# Patient Record
Sex: Female | Born: 1940 | Race: White | Hispanic: No | Smoking: Current every day smoker
Health system: Southern US, Community
[De-identification: ages and names within clinical notes are randomized; demographics above are authoritative.]

## PROBLEM LIST (undated history)

## (undated) DIAGNOSIS — T7840XA Allergy, unspecified, initial encounter: Secondary | ICD-10-CM

## (undated) DIAGNOSIS — M1990 Unspecified osteoarthritis, unspecified site: Secondary | ICD-10-CM

## (undated) DIAGNOSIS — K458 Other specified abdominal hernia without obstruction or gangrene: Secondary | ICD-10-CM

## (undated) DIAGNOSIS — I10 Essential (primary) hypertension: Secondary | ICD-10-CM

## (undated) DIAGNOSIS — J449 Chronic obstructive pulmonary disease, unspecified: Secondary | ICD-10-CM

## (undated) DIAGNOSIS — M810 Age-related osteoporosis without current pathological fracture: Secondary | ICD-10-CM

## (undated) HISTORY — PX: DILATION AND CURETTAGE OF UTERUS: SHX78

## (undated) HISTORY — DX: Allergy, unspecified, initial encounter: T78.40XA

## (undated) HISTORY — DX: Other specified abdominal hernia without obstruction or gangrene: K45.8

## (undated) HISTORY — DX: Essential (primary) hypertension: I10

---

## 1979-02-27 HISTORY — PX: OTHER SURGICAL HISTORY: SHX169

## 1979-02-27 HISTORY — PX: COLONOSCOPY: SHX174

## 2000-01-18 ENCOUNTER — Encounter: Admission: RE | Admit: 2000-01-18 | Discharge: 2000-01-18 | Payer: Self-pay | Admitting: *Deleted

## 2000-01-18 ENCOUNTER — Encounter: Payer: Self-pay | Admitting: *Deleted

## 2001-02-13 ENCOUNTER — Encounter: Admission: RE | Admit: 2001-02-13 | Discharge: 2001-02-13 | Payer: Self-pay | Admitting: *Deleted

## 2001-02-13 ENCOUNTER — Other Ambulatory Visit: Admission: RE | Admit: 2001-02-13 | Discharge: 2001-02-13 | Payer: Self-pay | Admitting: *Deleted

## 2001-02-13 ENCOUNTER — Encounter: Payer: Self-pay | Admitting: *Deleted

## 2002-02-12 ENCOUNTER — Encounter: Admission: RE | Admit: 2002-02-12 | Discharge: 2002-02-12 | Payer: Self-pay | Admitting: *Deleted

## 2002-02-12 ENCOUNTER — Encounter: Payer: Self-pay | Admitting: *Deleted

## 2002-11-05 ENCOUNTER — Other Ambulatory Visit: Admission: RE | Admit: 2002-11-05 | Discharge: 2002-11-05 | Payer: Self-pay | Admitting: Dermatology

## 2003-02-04 ENCOUNTER — Encounter: Payer: Self-pay | Admitting: *Deleted

## 2003-02-04 ENCOUNTER — Encounter: Admission: RE | Admit: 2003-02-04 | Discharge: 2003-02-04 | Payer: Self-pay | Admitting: *Deleted

## 2004-02-10 ENCOUNTER — Encounter: Admission: RE | Admit: 2004-02-10 | Discharge: 2004-02-10 | Payer: Self-pay | Admitting: *Deleted

## 2004-03-03 ENCOUNTER — Encounter: Admission: RE | Admit: 2004-03-03 | Discharge: 2004-03-03 | Payer: Self-pay | Admitting: *Deleted

## 2005-02-25 ENCOUNTER — Encounter: Admission: RE | Admit: 2005-02-25 | Discharge: 2005-02-25 | Payer: Self-pay | Admitting: *Deleted

## 2007-11-14 ENCOUNTER — Ambulatory Visit (HOSPITAL_COMMUNITY): Admission: RE | Admit: 2007-11-14 | Discharge: 2007-11-14 | Payer: Self-pay | Admitting: Family Medicine

## 2007-11-16 ENCOUNTER — Ambulatory Visit (HOSPITAL_COMMUNITY): Admission: RE | Admit: 2007-11-16 | Discharge: 2007-11-16 | Payer: Self-pay | Admitting: Family Medicine

## 2007-12-14 ENCOUNTER — Ambulatory Visit (HOSPITAL_COMMUNITY): Admission: RE | Admit: 2007-12-14 | Discharge: 2007-12-14 | Payer: Self-pay | Admitting: Ophthalmology

## 2008-06-17 ENCOUNTER — Other Ambulatory Visit: Admission: RE | Admit: 2008-06-17 | Discharge: 2008-06-17 | Payer: Self-pay | Admitting: Obstetrics & Gynecology

## 2010-05-27 ENCOUNTER — Ambulatory Visit (HOSPITAL_COMMUNITY): Admission: RE | Admit: 2010-05-27 | Discharge: 2010-05-27 | Payer: Self-pay | Admitting: Family Medicine

## 2010-07-19 ENCOUNTER — Encounter: Payer: Self-pay | Admitting: *Deleted

## 2011-01-13 ENCOUNTER — Other Ambulatory Visit: Payer: Self-pay | Admitting: Obstetrics & Gynecology

## 2011-01-13 DIAGNOSIS — Z139 Encounter for screening, unspecified: Secondary | ICD-10-CM

## 2011-01-21 ENCOUNTER — Ambulatory Visit (HOSPITAL_COMMUNITY)
Admission: RE | Admit: 2011-01-21 | Discharge: 2011-01-21 | Disposition: A | Discharge status: Home / Self Care | Payer: Medicare Other | Source: Ambulatory Visit | Attending: Obstetrics & Gynecology | Admitting: Obstetrics & Gynecology

## 2011-01-21 DIAGNOSIS — Z139 Encounter for screening, unspecified: Secondary | ICD-10-CM

## 2011-01-21 DIAGNOSIS — Z1231 Encounter for screening mammogram for malignant neoplasm of breast: Secondary | ICD-10-CM | POA: Insufficient documentation

## 2011-02-23 ENCOUNTER — Other Ambulatory Visit (HOSPITAL_COMMUNITY)
Admission: RE | Admit: 2011-02-23 | Discharge: 2011-02-23 | Disposition: A | Discharge status: Home / Self Care | Payer: Medicare Other | Source: Ambulatory Visit | Attending: Obstetrics & Gynecology | Admitting: Obstetrics & Gynecology

## 2011-02-23 DIAGNOSIS — Z124 Encounter for screening for malignant neoplasm of cervix: Secondary | ICD-10-CM | POA: Insufficient documentation

## 2011-03-25 LAB — BASIC METABOLIC PANEL
BUN: 20
Chloride: 104
GFR calc Af Amer: >60

## 2011-03-25 LAB — CBC
RDW: 14.1
WBC: 6.6

## 2011-06-11 ENCOUNTER — Other Ambulatory Visit (HOSPITAL_COMMUNITY): Payer: Self-pay | Admitting: Family Medicine

## 2011-06-11 DIAGNOSIS — Z01419 Encounter for gynecological examination (general) (routine) without abnormal findings: Secondary | ICD-10-CM

## 2011-06-11 DIAGNOSIS — Z139 Encounter for screening, unspecified: Secondary | ICD-10-CM

## 2011-06-16 ENCOUNTER — Ambulatory Visit (HOSPITAL_COMMUNITY)
Admission: RE | Admit: 2011-06-16 | Discharge: 2011-06-16 | Disposition: A | Discharge status: Home / Self Care | Payer: Medicare Other | Source: Ambulatory Visit | Attending: Family Medicine | Admitting: Family Medicine

## 2011-06-16 DIAGNOSIS — Z139 Encounter for screening, unspecified: Secondary | ICD-10-CM

## 2011-06-16 DIAGNOSIS — Z01419 Encounter for gynecological examination (general) (routine) without abnormal findings: Secondary | ICD-10-CM

## 2011-06-16 DIAGNOSIS — M899 Disorder of bone, unspecified: Secondary | ICD-10-CM | POA: Insufficient documentation

## 2011-06-30 ENCOUNTER — Telehealth: Payer: Self-pay

## 2011-06-30 NOTE — Telephone Encounter (Signed)
Called pt and she is getting ready to go out of town to [place] for [employer]. Not sure how long she will be gone. She will call me when she is ready to schedule.

## 2013-01-02 ENCOUNTER — Other Ambulatory Visit: Payer: Self-pay | Admitting: Obstetrics & Gynecology

## 2013-01-02 DIAGNOSIS — Z139 Encounter for screening, unspecified: Secondary | ICD-10-CM

## 2013-01-05 ENCOUNTER — Ambulatory Visit (HOSPITAL_COMMUNITY)
Admission: RE | Admit: 2013-01-05 | Discharge: 2013-01-05 | Disposition: A | Discharge status: Home / Self Care | Payer: Medicare Other | Source: Ambulatory Visit | Attending: Obstetrics & Gynecology | Admitting: Obstetrics & Gynecology

## 2013-01-05 DIAGNOSIS — Z139 Encounter for screening, unspecified: Secondary | ICD-10-CM

## 2013-01-05 DIAGNOSIS — Z1231 Encounter for screening mammogram for malignant neoplasm of breast: Secondary | ICD-10-CM | POA: Insufficient documentation

## 2013-02-06 ENCOUNTER — Ambulatory Visit (INDEPENDENT_AMBULATORY_CARE_PROVIDER_SITE_OTHER): Payer: Medicare Other | Admitting: Obstetrics & Gynecology

## 2013-02-06 ENCOUNTER — Encounter: Payer: Self-pay | Admitting: Obstetrics & Gynecology

## 2013-02-06 ENCOUNTER — Other Ambulatory Visit (HOSPITAL_COMMUNITY)
Admission: RE | Admit: 2013-02-06 | Discharge: 2013-02-06 | Disposition: A | Discharge status: Home / Self Care | Payer: Medicare Other | Source: Ambulatory Visit | Attending: Obstetrics & Gynecology | Admitting: Obstetrics & Gynecology

## 2013-02-06 VITALS — BP 160/90 | Ht 60.0 in | Wt 102.0 lb

## 2013-02-06 DIAGNOSIS — Z1151 Encounter for screening for human papillomavirus (HPV): Secondary | ICD-10-CM | POA: Insufficient documentation

## 2013-02-06 DIAGNOSIS — Z1212 Encounter for screening for malignant neoplasm of rectum: Secondary | ICD-10-CM

## 2013-02-06 DIAGNOSIS — M1990 Unspecified osteoarthritis, unspecified site: Secondary | ICD-10-CM

## 2013-02-06 DIAGNOSIS — I10 Essential (primary) hypertension: Secondary | ICD-10-CM | POA: Insufficient documentation

## 2013-02-06 DIAGNOSIS — Z01419 Encounter for gynecological examination (general) (routine) without abnormal findings: Secondary | ICD-10-CM | POA: Insufficient documentation

## 2013-02-06 MED ORDER — RISEDRONATE SODIUM 150 MG PO TABS: 150.0000 mg | ORAL_TABLET | ORAL | Status: DC

## 2013-02-06 NOTE — Addendum Note (Signed)
Addended by: Criss Alvine on: 02/06/2013 11:30 AM   Modules accepted: Orders

## 2013-02-06 NOTE — Progress Notes (Signed)
Patient ID: Chelsea Ramos, female   DOB: 12/07/1940, 72 y.o.   MRN: [id number] Subjective:     Chelsea Ramos is a 72 y.o. female here for a routine exam.  No LMP recorded. Patient is postmenopausal. No obstetric history on file. Current complaints: none.  Personal health questionnaire reviewed: no.   Gynecologic History No LMP recorded. Patient is postmenopausal. Contraception: post menopausal status Last Pap: 2012. Results were: normal Last mammogram: 2014. Results were: normal  Obstetric History OB History   Grav Para Term Preterm Abortions TAB SAB Ect Mult Living                   The following portions of the patient's history were reviewed and updated as appropriate: allergies, current medications, past family history, past medical history, past social history, past surgical history and problem list.  Review of Systems  Review of Systems  Constitutional: Negative for fever, chills, weight loss, malaise/fatigue and diaphoresis.  HENT: Negative for hearing loss, ear pain, nosebleeds, congestion, sore throat, neck pain, tinnitus and ear discharge.   Eyes: Negative for blurred vision, double vision, photophobia, pain, discharge and redness.  Respiratory: Negative for cough, hemoptysis, sputum production, shortness of breath, wheezing and stridor.   Cardiovascular: Negative for chest pain, palpitations, orthopnea, claudication, leg swelling and PND.  Gastrointestinal: negative for abdominal pain. Negative for heartburn, nausea, vomiting, diarrhea, constipation, blood in stool and melena.  Genitourinary: Negative for dysuria, urgency, frequency, hematuria and flank pain.  Musculoskeletal: Negative for myalgias, back pain, joint pain and falls.  Skin: Negative for itching and rash.  Neurological: Negative for dizziness, tingling, tremors, sensory change, speech change, focal weakness, seizures, loss of consciousness, weakness and headaches.  Endo/Heme/Allergies: Negative for environmental  allergies and polydipsia. Does not bruise/bleed easily.  Psychiatric/Behavioral: Negative for depression, suicidal ideas, hallucinations, memory loss and substance abuse. The patient is not nervous/anxious and does not have insomnia.        Objective:    Physical Exam  Vitals reviewed. Constitutional: She is oriented to person, place, and time. She appears well-developed and well-nourished.  HENT:  Head: Normocephalic and atraumatic.        Right Ear: External ear normal.  Left Ear: External ear normal.  Nose: Nose normal.  Mouth/Throat: Oropharynx is clear and moist.  Eyes: Conjunctivae and EOM are normal. Pupils are equal, round, and reactive to light. Right eye exhibits no discharge. Left eye exhibits no discharge. No scleral icterus.  Neck: Normal range of motion. Neck supple. No tracheal deviation present. No thyromegaly present.  Cardiovascular: Normal rate, regular rhythm, normal heart sounds and intact distal pulses.  Exam reveals no gallop and no friction rub.   No murmur heard. Respiratory: Effort normal and breath sounds normal. No respiratory distress. She has no wheezes. She has no rales. She exhibits no tenderness.  Breast exam normal no masses tenderness skin or nipple changes GI: Soft. Bowel sounds are normal. She exhibits no distension and no mass. There is no tenderness. There is no rebound and no guarding.  Genitourinary:       Vulva is normal without lesions Vagina is pink moist without discharge Cervix normal in appearance and pap is done Uterus is normal size shape and contour Adnexa is negative with normal sized ovaries   Rectal hemeoccult negative, no masses normal tone Musculoskeletal: Normal range of motion. She exhibits no edema and no tenderness.  Neurological: She is alert and oriented to person, place, and time. She has normal reflexes.  She displays normal reflexes. No cranial nerve deficit. She exhibits normal muscle tone. Coordination normal.  Skin:  Skin is warm and dry. No rash noted. No erythema. No pallor.  Psychiatric: She has a normal mood and affect. Her behavior is normal. Judgment and thought content normal.       Assessment:    Healthy female exam.    Plan:    Mammogram ordered. Follow up in: 1 year.

## 2013-02-06 NOTE — Addendum Note (Signed)
Addended by: Lazaro Arms on: 02/06/2013 11:12 AM   Modules accepted: Orders

## 2013-07-25 ENCOUNTER — Other Ambulatory Visit (HOSPITAL_COMMUNITY): Payer: Self-pay | Admitting: Family Medicine

## 2013-07-25 ENCOUNTER — Ambulatory Visit (HOSPITAL_COMMUNITY)
Admission: RE | Admit: 2013-07-25 | Discharge: 2013-07-25 | Disposition: A | Discharge status: Home / Self Care | Payer: Medicare Other | Source: Ambulatory Visit | Attending: Family Medicine | Admitting: Family Medicine

## 2013-07-25 DIAGNOSIS — I10 Essential (primary) hypertension: Secondary | ICD-10-CM | POA: Insufficient documentation

## 2013-07-25 DIAGNOSIS — J984 Other disorders of lung: Secondary | ICD-10-CM | POA: Insufficient documentation

## 2013-07-25 DIAGNOSIS — Z0000 Encounter for general adult medical examination without abnormal findings: Secondary | ICD-10-CM

## 2013-07-25 DIAGNOSIS — R059 Cough, unspecified: Secondary | ICD-10-CM | POA: Insufficient documentation

## 2013-07-25 DIAGNOSIS — R05 Cough: Secondary | ICD-10-CM | POA: Insufficient documentation

## 2013-07-25 DIAGNOSIS — R0602 Shortness of breath: Secondary | ICD-10-CM | POA: Insufficient documentation

## 2013-07-25 DIAGNOSIS — J4489 Other specified chronic obstructive pulmonary disease: Secondary | ICD-10-CM | POA: Insufficient documentation

## 2013-07-25 DIAGNOSIS — Z87891 Personal history of nicotine dependence: Secondary | ICD-10-CM | POA: Insufficient documentation

## 2013-07-25 DIAGNOSIS — J449 Chronic obstructive pulmonary disease, unspecified: Secondary | ICD-10-CM | POA: Insufficient documentation

## 2013-10-02 ENCOUNTER — Telehealth: Payer: Self-pay

## 2013-10-02 NOTE — Telephone Encounter (Signed)
PT was referred by Dr. Orson Ape for screening colonoscopy. LMOM for a return call.

## 2013-11-14 NOTE — Telephone Encounter (Signed)
Letter to PCP

## 2014-03-08 ENCOUNTER — Other Ambulatory Visit: Payer: Self-pay | Admitting: Obstetrics & Gynecology

## 2014-10-29 ENCOUNTER — Other Ambulatory Visit: Payer: Self-pay | Admitting: Obstetrics & Gynecology

## 2014-10-29 DIAGNOSIS — Z1231 Encounter for screening mammogram for malignant neoplasm of breast: Secondary | ICD-10-CM

## 2014-11-12 ENCOUNTER — Ambulatory Visit (INDEPENDENT_AMBULATORY_CARE_PROVIDER_SITE_OTHER): Payer: Medicare HMO | Admitting: Obstetrics & Gynecology

## 2014-11-12 ENCOUNTER — Encounter: Payer: Self-pay | Admitting: Obstetrics & Gynecology

## 2014-11-12 VITALS — BP 140/90 | HR 100 | Ht 61.4 in | Wt 100.0 lb

## 2014-11-12 DIAGNOSIS — Z01419 Encounter for gynecological examination (general) (routine) without abnormal findings: Secondary | ICD-10-CM

## 2014-11-12 MED ORDER — RISEDRONATE SODIUM 150 MG PO TABS: ORAL_TABLET | ORAL | Status: DC

## 2014-11-12 NOTE — Progress Notes (Signed)
Patient ID: Chelsea Ramos, female   DOB: [birth year], 75 y.o.   MRN: [id number] Subjective:     Chelsea Ramos is a 74 y.o. female here for a routine exam.  No LMP recorded. Patient is postmenopausal. No obstetric history on file. Birth Control Method:  postmenopausal Current complaints: discharge, yellow with some blood occasionally, worse in the afternoon.   Current acute medical issues:  none   Recent Gynecologic History No LMP recorded. Patient is postmenopausal. Last Pap: 2014,  normal Last mammogram: 2015, scheduled this Friday  Past Medical History  Diagnosis Date  . Allergy   . Hypertension     History reviewed. No pertinent past surgical history.  OB History    No data available      History   Social History  . Marital Status: Widowed    Spouse Name: N/A  . Number of Children: N/A  . Years of Education: N/A   Social History Main Topics  . Smoking status: Former Research scientist (life sciences)  . Smokeless tobacco: Not on file     Comment: cigarettes only.  . Alcohol Use: Not on file  . Drug Use: Not on file  . Sexual Activity: Not on file   Other Topics Concern  . None   Social History Narrative    Family History  Problem Relation Age of Onset  . Prostate cancer Father   . Diabetes Neg Hx   . Heart disease Mother      Current outpatient prescriptions:  .  aspirin 81 MG tablet, Take 81 mg by mouth daily., Disp: , Rfl:  .  lisinopril (PRINIVIL,ZESTRIL) 5 MG tablet, Take 5 mg by mouth daily., Disp: , Rfl:  .  Multiple Vitamin (MULTIVITAMIN) capsule, Take 1 capsule by mouth daily., Disp: , Rfl:  .  naproxen (NAPROSYN) 500 MG tablet, Take 500 mg by mouth 2 (two) times daily with a meal., Disp: , Rfl:  .  risedronate (ACTONEL) 150 MG tablet, TAKE ONE TABLET BY MOUTH EVERY 30 DAYS WITH WATER ON EMPTY STOMACH, NOTHING BY MOUTH OR LIE DOWN FOR NEXT 30 MINUTES, Disp: 1 tablet, Rfl: 11 .  Multiple Vitamins-Calcium (DAILY COMBO MULTIVITS/CALCIUM PO), Take by mouth., Disp: , Rfl:   Review  of Systems  Review of Systems  Constitutional: Negative for fever, chills, weight loss, malaise/fatigue and diaphoresis.  HENT: Negative for hearing loss, ear pain, nosebleeds, congestion, sore throat, neck pain, tinnitus and ear discharge.   Eyes: Negative for blurred vision, double vision, photophobia, pain, discharge and redness.  Respiratory: Negative for cough, hemoptysis, sputum production, shortness of breath, wheezing and stridor.   Cardiovascular: Negative for chest pain, palpitations, orthopnea, claudication, leg swelling and PND.  Gastrointestinal: negative for abdominal pain. Negative for heartburn, nausea, vomiting, diarrhea, constipation, blood in stool and melena.  Genitourinary: Negative for dysuria, urgency, frequency, hematuria and flank pain.  Musculoskeletal: Negative for myalgias, back pain, joint pain and falls.  Skin: Negative for itching and rash.  Neurological: Negative for dizziness, tingling, tremors, sensory change, speech change, focal weakness, seizures, loss of consciousness, weakness and headaches.  Endo/Heme/Allergies: Negative for environmental allergies and polydipsia. Does not bruise/bleed easily.  Psychiatric/Behavioral: Negative for depression, suicidal ideas, hallucinations, memory loss and substance abuse. The patient is not nervous/anxious and does not have insomnia.        Objective:  Blood pressure 140/90, pulse 100, height 5' 1.4" (1.56 m), weight 100 lb (45.36 kg).   Physical Exam  Vitals reviewed. Constitutional: She is oriented to person, place, and  time. She appears well-developed and well-nourished.  HENT:  Head: Normocephalic and atraumatic.        Right Ear: External ear normal.  Left Ear: External ear normal.  Nose: Nose normal.  Mouth/Throat: Oropharynx is clear and moist.  Eyes: Conjunctivae and EOM are normal. Pupils are equal, round, and reactive to light. Right eye exhibits no discharge. Left eye exhibits no discharge. No scleral  icterus.  Neck: Normal range of motion. Neck supple. No tracheal deviation present. No thyromegaly present.  Cardiovascular: Normal rate, regular rhythm, normal heart sounds and intact distal pulses.  Exam reveals no gallop and no friction rub.   No murmur heard. Respiratory: Effort normal and breath sounds normal. No respiratory distress. She has no wheezes. She has no rales. She exhibits no tenderness.  GI: Soft. Bowel sounds are normal. She exhibits no distension and no mass. There is no tenderness. There is no rebound and no guarding.  Genitourinary:  Breasts no masses skin changes or nipple changes bilaterally      Vulva is normal without lesions Vagina is pink moist without discharge Cervix normal in appearance and pap is not done Uterus is normal size shape and contour Adnexa is negative with normal sized ovaries  Musculoskeletal: Normal range of motion. She exhibits no edema and no tenderness.  Neurological: She is alert and oriented to person, place, and time. She has normal reflexes. She displays normal reflexes. No cranial nerve deficit. She exhibits normal muscle tone. Coordination normal.  Skin: Skin is warm and dry. No rash noted. No erythema. No pallor.  Psychiatric: She has a normal mood and affect. Her behavior is normal. Judgment and thought content normal.       Assessment:    Healthy female exam.  Atrophic vaginal changes with associated discharge   Plan:    Mammogram ordered. Follow up in: 2-3 years.

## 2014-11-15 ENCOUNTER — Ambulatory Visit (HOSPITAL_COMMUNITY)
Admission: RE | Admit: 2014-11-15 | Discharge: 2014-11-15 | Disposition: A | Discharge status: Home / Self Care | Payer: Medicare HMO | Source: Ambulatory Visit | Attending: Obstetrics & Gynecology | Admitting: Obstetrics & Gynecology

## 2014-11-15 DIAGNOSIS — Z1231 Encounter for screening mammogram for malignant neoplasm of breast: Secondary | ICD-10-CM | POA: Insufficient documentation

## 2014-12-11 ENCOUNTER — Other Ambulatory Visit (HOSPITAL_COMMUNITY): Payer: Self-pay | Admitting: Internal Medicine

## 2014-12-11 ENCOUNTER — Ambulatory Visit (HOSPITAL_COMMUNITY)
Admission: RE | Admit: 2014-12-11 | Discharge: 2014-12-11 | Disposition: A | Discharge status: Home / Self Care | Payer: Medicare HMO | Source: Ambulatory Visit | Attending: Internal Medicine | Admitting: Internal Medicine

## 2014-12-11 DIAGNOSIS — R059 Cough, unspecified: Secondary | ICD-10-CM

## 2014-12-11 DIAGNOSIS — R05 Cough: Secondary | ICD-10-CM

## 2015-01-20 ENCOUNTER — Telehealth: Payer: Self-pay

## 2015-01-20 NOTE — Telephone Encounter (Signed)
Had received a VM from [facility] that pt wants to be scheduled between 01/27/2015 and 02/14/2015 for screening colonoscopy.

## 2015-01-20 NOTE — Telephone Encounter (Signed)
I called pt and she said she will not be able to do the colonoscopy in August this year.  She will call sometime in August and get scheduled for September.  I am sending Larene Pickett a letter.

## 2015-11-06 ENCOUNTER — Telehealth: Payer: Self-pay

## 2015-11-06 NOTE — Telephone Encounter (Signed)
Triaged pt today.

## 2015-11-10 NOTE — Telephone Encounter (Signed)
Ok to schedule.

## 2015-11-10 NOTE — Telephone Encounter (Addendum)
Gastroenterology Pre-Procedure Review  Request Date: 11/06/2015 Requesting Physician:  Dr. Gerarda Fraction  PATIENT REVIEW QUESTIONS: The patient responded to the following health history questions as indicated:    1. Diabetes Melitis: no 2. Joint replacements in the past 12 months: no 3. Major health problems in the past 3 months: no 4. Has an artificial valve or MVP: no 5. Has a defibrillator: no 6. Has been advised in past to take antibiotics in advance of a procedure like teeth cleaning: no 7. Family history of colon cancer: no  8. Alcohol Use: no 9. History of sleep apnea: no     MEDICATIONS & ALLERGIES:    Patient reports the following regarding taking any blood thinners:   Plavix? NO Aspirin? YES Coumadin? no  Patient confirms/reports the following medications:  Current Outpatient Prescriptions  Medication Sig Dispense Refill  . aspirin 81 MG tablet Take 81 mg by mouth daily.    Marland Kitchen losartan (COZAAR) 25 MG tablet Take 25 mg by mouth daily.    . Multiple Vitamin (MULTIVITAMIN) capsule Take 1 capsule by mouth daily.    . Multiple Vitamins-Calcium (DAILY COMBO MULTIVITS/CALCIUM PO) Take by mouth.    . naproxen (NAPROSYN) 500 MG tablet Take 500 mg by mouth 2 (two) times daily with a meal.    . risedronate (ACTONEL) 150 MG tablet TAKE ONE TABLET BY MOUTH EVERY 30 DAYS WITH WATER ON EMPTY STOMACH, NOTHING BY MOUTH OR LIE DOWN FOR NEXT 30 MINUTES 1 tablet 11   No current facility-administered medications for this visit.    Patient confirms/reports the following allergies:  Allergies  Allergen Reactions  . Prednisone Itching    No orders of the defined types were placed in this encounter.    AUTHORIZATION INFORMATION Primary Insurance:   ID #:  Group #:  Pre-Cert / Auth required:  Pre-Cert / Auth #:   Secondary Insurance:   ID #:  Group #:  Pre-Cert / Auth required: Pre-Cert / Auth #:   SCHEDULE INFORMATION: Procedure has been scheduled as follows:  Date:  11/25/2015                Time: 8:30 Am  Location: [facility] ([place])  This Gastroenterology Pre-Precedure Review Form is being routed to the following provider(s): R. Garfield Cornea, MD

## 2015-11-11 ENCOUNTER — Other Ambulatory Visit: Payer: Self-pay

## 2015-11-11 DIAGNOSIS — Z1211 Encounter for screening for malignant neoplasm of colon: Secondary | ICD-10-CM

## 2015-11-11 MED ORDER — NA SULFATE-K SULFATE-MG SULF 17.5-3.13-1.6 GM/177ML PO SOLN: 1.0000 | ORAL | Status: DC

## 2015-11-11 NOTE — Telephone Encounter (Signed)
Rx sent to the pharmacy and instructions mailed to pt.  

## 2015-11-14 NOTE — Telephone Encounter (Signed)
Pt called and said she forgot to tell me about her hernia, low on her left side. Said she is not sure that is what it is, it has been there several months and it appeared after she had done some lifting and pushing a mower. Said she is very independent and hasn't told anyone and she wanted to know if it would interfere with her colonoscopy. She said she does not have any pain from it. I told her to see her PCP next week, Dr. Gerarda Fraction and then let us know. She said she would.

## 2015-11-14 NOTE — Telephone Encounter (Signed)
Agree with having evaluation by PCP. OK to proceed with colonoscopy.

## 2015-11-18 ENCOUNTER — Telehealth: Payer: Self-pay

## 2015-11-18 NOTE — Telephone Encounter (Signed)
I called [employer] at [phone] and got automation. Pa not required for screening colonoscopy as out patient.

## 2015-11-18 NOTE — Telephone Encounter (Signed)
Pt is aware. She has appt with PCP today at 3:00 pm.

## 2015-11-19 NOTE — Telephone Encounter (Signed)
PT called and said she saw dr. Gerarda Fraction yesterday. He said she did have a small hernia, but he agrees, no problem with having a colonoscopy at this time.

## 2015-11-19 NOTE — Telephone Encounter (Signed)
Noted  

## 2015-11-25 ENCOUNTER — Ambulatory Visit (HOSPITAL_COMMUNITY)
Admission: RE | Admit: 2015-11-25 | Discharge: 2015-11-25 | Disposition: A | Discharge status: Home / Self Care | Payer: Medicare HMO | Source: Ambulatory Visit | Attending: Internal Medicine | Admitting: Internal Medicine

## 2015-11-25 ENCOUNTER — Encounter (HOSPITAL_COMMUNITY)
Admission: RE | Disposition: A | Discharge status: Home / Self Care | Payer: Self-pay | Source: Ambulatory Visit | Attending: Internal Medicine

## 2015-11-25 ENCOUNTER — Encounter (HOSPITAL_COMMUNITY): Payer: Self-pay | Admitting: *Deleted

## 2015-11-25 DIAGNOSIS — M810 Age-related osteoporosis without current pathological fracture: Secondary | ICD-10-CM | POA: Diagnosis not present

## 2015-11-25 DIAGNOSIS — Z8042 Family history of malignant neoplasm of prostate: Secondary | ICD-10-CM | POA: Insufficient documentation

## 2015-11-25 DIAGNOSIS — I10 Essential (primary) hypertension: Secondary | ICD-10-CM | POA: Diagnosis not present

## 2015-11-25 DIAGNOSIS — J449 Chronic obstructive pulmonary disease, unspecified: Secondary | ICD-10-CM | POA: Insufficient documentation

## 2015-11-25 DIAGNOSIS — Z87891 Personal history of nicotine dependence: Secondary | ICD-10-CM | POA: Insufficient documentation

## 2015-11-25 DIAGNOSIS — Z79899 Other long term (current) drug therapy: Secondary | ICD-10-CM | POA: Insufficient documentation

## 2015-11-25 DIAGNOSIS — Z791 Long term (current) use of non-steroidal anti-inflammatories (NSAID): Secondary | ICD-10-CM | POA: Diagnosis not present

## 2015-11-25 DIAGNOSIS — Z1211 Encounter for screening for malignant neoplasm of colon: Secondary | ICD-10-CM

## 2015-11-25 DIAGNOSIS — Z7982 Long term (current) use of aspirin: Secondary | ICD-10-CM | POA: Insufficient documentation

## 2015-11-25 DIAGNOSIS — K641 Second degree hemorrhoids: Secondary | ICD-10-CM | POA: Diagnosis not present

## 2015-11-25 DIAGNOSIS — M1990 Unspecified osteoarthritis, unspecified site: Secondary | ICD-10-CM | POA: Diagnosis not present

## 2015-11-25 DIAGNOSIS — K5730 Diverticulosis of large intestine without perforation or abscess without bleeding: Secondary | ICD-10-CM | POA: Diagnosis not present

## 2015-11-25 HISTORY — DX: Chronic obstructive pulmonary disease, unspecified: J44.9

## 2015-11-25 HISTORY — PX: COLONOSCOPY: SHX5424

## 2015-11-25 HISTORY — DX: Unspecified osteoarthritis, unspecified site: M19.90

## 2015-11-25 HISTORY — DX: Age-related osteoporosis without current pathological fracture: M81.0

## 2015-11-25 SURGERY — COLONOSCOPY
Anesthesia: Moderate Sedation

## 2015-11-25 MED ORDER — MEPERIDINE HCL 100 MG/ML IJ SOLN
INTRAMUSCULAR | Status: AC
  Filled 2015-11-25: qty 2

## 2015-11-25 MED ORDER — MIDAZOLAM HCL 5 MG/5ML IJ SOLN
INTRAMUSCULAR | Status: AC
  Filled 2015-11-25: qty 10

## 2015-11-25 MED ORDER — SODIUM CHLORIDE 0.9 % IV SOLN
INTRAVENOUS | Status: DC
Start: 2015-11-25 — End: 2015-11-25
  Administered 2015-11-25: 08:00:00 via INTRAVENOUS

## 2015-11-25 MED ORDER — MEPERIDINE HCL 100 MG/ML IJ SOLN
INTRAMUSCULAR | Status: DC | PRN
  Administered 2015-11-25 (×2): 25 mg via INTRAVENOUS

## 2015-11-25 MED ORDER — STERILE WATER FOR IRRIGATION IR SOLN
Status: DC | PRN
  Administered 2015-11-25: 08:00:00

## 2015-11-25 MED ORDER — ONDANSETRON HCL 4 MG/2ML IJ SOLN
INTRAMUSCULAR | Status: AC
  Filled 2015-11-25: qty 2

## 2015-11-25 MED ORDER — MIDAZOLAM HCL 5 MG/5ML IJ SOLN
INTRAMUSCULAR | Status: DC | PRN
  Administered 2015-11-25 (×2): 1 mg via INTRAVENOUS
  Administered 2015-11-25: 2 mg via INTRAVENOUS

## 2015-11-25 MED ORDER — ONDANSETRON HCL 4 MG/2ML IJ SOLN
INTRAMUSCULAR | Status: DC | PRN
Start: 2015-11-25 — End: 2015-11-25
  Administered 2015-11-25: 4 mg via INTRAVENOUS

## 2015-11-25 NOTE — Discharge Instructions (Signed)
Colonoscopy Discharge Instructions  Read the instructions outlined below and refer to this sheet in the next few weeks. These discharge instructions provide you with general information on caring for yourself after you leave the hospital. Your doctor may also give you specific instructions. While your treatment has been planned according to the most current medical practices available, unavoidable complications occasionally occur. If you have any problems or questions after discharge, call Dr. Gala Romney at [phone]. ACTIVITY  You may resume your regular activity, but move at a slower pace for the next 24 hours.   Take frequent rest periods for the next 24 hours.   Walking will help get rid of the air and reduce the bloated feeling in your belly (abdomen).   No driving for 24 hours (because of the medicine (anesthesia) used during the test).    Do not sign any important legal documents or operate any machinery for 24 hours (because of the anesthesia used during the test).  NUTRITION  Drink plenty of fluids.   You may resume your normal diet as instructed by your doctor.   Begin with a light meal and progress to your normal diet. Heavy or fried foods are harder to digest and may make you feel sick to your stomach (nauseated).   Avoid alcoholic beverages for 24 hours or as instructed.  MEDICATIONS  You may resume your normal medications unless your doctor tells you otherwise.  WHAT YOU CAN EXPECT TODAY  Some feelings of bloating in the abdomen.   Passage of more gas than usual.   Spotting of blood in your stool or on the toilet paper.  IF YOU HAD POLYPS REMOVED DURING THE COLONOSCOPY:  No aspirin products for 7 days or as instructed.   No alcohol for 7 days or as instructed.   Eat a soft diet for the next 24 hours.  FINDING OUT THE RESULTS OF YOUR TEST Not all test results are available during your visit. If your test results are not back during the visit, make an appointment  with your caregiver to find out the results. Do not assume everything is normal if you have not heard from your caregiver or the medical facility. It is important for you to follow up on all of your test results.  SEEK IMMEDIATE MEDICAL ATTENTION IF:  You have more than a spotting of blood in your stool.   Your belly is swollen (abdominal distention).   You are nauseated or vomiting.   You have a temperature over 101.   You have abdominal pain or discomfort that is severe or gets worse throughout the day.     Diverticulosis information provided  I do not recommend a future colonoscopy unless new symptoms develop  Diverticulosis Diverticulosis is the condition that develops when small pouches (diverticula) form in the wall of your colon. Your colon, or large intestine, is where water is absorbed and stool is formed. The pouches form when the inside layer of your colon pushes through weak spots in the outer layers of your colon. CAUSES  No one knows exactly what causes diverticulosis. RISK FACTORS  Being older than 73. Your risk for this condition increases with age. Diverticulosis is rare in people younger than 40 years. By age 46, almost everyone has it.  Eating a low-fiber diet.  Being frequently constipated.  Being overweight.  Not getting enough exercise.  Smoking.  Taking over-the-counter pain medicines, like aspirin and ibuprofen. SYMPTOMS  Most people with diverticulosis do not have symptoms. DIAGNOSIS  Because diverticulosis often has no symptoms, health care providers often discover the condition during an exam for other colon problems. In many cases, a health care provider will diagnose diverticulosis while using a flexible scope to examine the colon (colonoscopy). TREATMENT  If you have never developed an infection related to diverticulosis, you may not need treatment. If you have had an infection before, treatment may include:  Eating more fruits, vegetables,  and grains.  Taking a fiber supplement.  Taking a live bacteria supplement (probiotic).  Taking medicine to relax your colon. HOME CARE INSTRUCTIONS   Drink at least 6-8 glasses of water each day to prevent constipation.  Try not to strain when you have a bowel movement.  Keep all follow-up appointments. If you have had an infection before:  Increase the fiber in your diet as directed by your health care provider or dietitian.  Take a dietary fiber supplement if your health care provider approves.  Only take medicines as directed by your health care provider. SEEK MEDICAL CARE IF:   You have abdominal pain.  You have bloating.  You have cramps.  You have not gone to the bathroom in 3 days. SEEK IMMEDIATE MEDICAL CARE IF:   Your pain gets worse.  Yourbloating becomes very bad.  You have a fever or chills, and your symptoms suddenly get worse.  You begin vomiting.  You have bowel movements that are bloody or black. MAKE SURE YOU:  Understand these instructions.  Will watch your condition.  Will get help right away if you are not doing well or get worse.   This information is not intended to replace advice given to you by your health care provider. Make sure you discuss any questions you have with your health care provider.   Document Released: 03/11/2004 Document Revised: 06/19/2013 Document Reviewed: 05/09/2013 Elsevier Interactive Patient Education [employer].

## 2015-11-25 NOTE — Op Note (Signed)
[facility] Patient Name: Chelsea Ramos Procedure Date: 11/25/2015 8:07 AM MRN: [id number] Date of Birth: [birth year] Attending MD: Norvel Richards , MD CSN: [id number] Age: 75 Admit Type: Outpatient Procedure:                Colonoscopy - screening Indications:              Screening for colorectal malignant neoplasm Providers:                Norvel Richards, MD, Gwenlyn Fudge, RN, Georgeann Oppenheim, Technician Referring MD:              Medicines:                Midazolam 4 mg IV, Meperidine 50 mg IV, Ondansetron                            4 mg IV Complications:            No immediate complications. Estimated Blood Loss:     Estimated blood loss: none. Procedure:                Pre-Anesthesia Assessment:                           - Prior to the procedure, a History and Physical                            was performed, and patient medications and                            allergies were reviewed. The patient's tolerance of                            previous anesthesia was also reviewed. The risks                            and benefits of the procedure and the sedation                            options and risks were discussed with the patient.                            All questions were answered, and informed consent                            was obtained. Prior Anticoagulants: The patient has                            taken no previous anticoagulant or antiplatelet                            agents. ASA Grade Assessment: II - A patient with  mild systemic disease. After reviewing the risks                            and benefits, the patient was deemed in                            satisfactory condition to undergo the procedure.                           After obtaining informed consent, the colonoscope                            was passed under direct vision. Throughout the                            procedure,  the patient's blood pressure, pulse, and                            oxygen saturations were monitored continuously. The                            [id number]) scope was introduced through                            the anus and advanced to the the cecum, identified                            by appendiceal orifice and ileocecal valve. The                            colonoscopy was performed without difficulty. The                            patient tolerated the procedure well. The entire                            colon was examined. The ileocecal valve,                            appendiceal orifice, and rectum were photographed.                            The quality of the bowel preparation was adequate. Scope In: 8:22:59 AM Scope Out: 8:35:46 AM Scope Withdrawal Time: 0 hours 6 minutes 51 seconds  Total Procedure Duration: 0 hours 12 minutes 47 seconds  Findings:      The perianal and digital rectal examinations were normal.      Hemorrhoids were found during retroflexion. The hemorrhoids were Grade       II (internal hemorrhoids that prolapse but reduce spontaneously).      Medium-mouthed diverticula were found in the sigmoid colon.      The exam was otherwise without abnormality on direct and retroflexion       views. Impression:               -  Hemorrhoids.                           - Diverticulosis in the sigmoid colon.                           - The examination was otherwise normal on direct                            and retroflexion views.                           - No specimens collected. Moderate Sedation:      Moderate (conscious) sedation was administered by the endoscopy nurse       and supervised by the endoscopist. The following parameters were       monitored: oxygen saturation, heart rate, blood pressure, respiratory       rate, EKG, adequacy of pulmonary ventilation, and response to care.       Total physician intraservice time was 20  minutes. Recommendation:           - Patient has a contact number available for                            emergencies. The signs and symptoms of potential                            delayed complications were discussed with the                            patient. Return to normal activities tomorrow.                            Written discharge instructions were provided to the                            patient.                           - Advance diet as tolerated.                           - Continue present medications.                           - No repeat colonoscopy due to age.                           - Return to GI clinic PRN. Procedure Code(s):        --- Professional ---                           [phone], Colonoscopy, flexible; diagnostic, including                            collection of specimen(s) by brushing or washing,  when performed (separate procedure)                           99152, Moderate sedation services provided by the                            same physician or other qualified health care                            professional performing the diagnostic or                            therapeutic service that the sedation supports,                            requiring the presence of an independent trained                            observer to assist in the monitoring of the                            patient's level of consciousness and physiological                            status; initial 15 minutes of intraservice time,                            patient age 70 years or older Diagnosis Code(s):        --- Professional ---                           Z12.11, Encounter for screening for malignant                            neoplasm of colon                           K64.1, Second degree hemorrhoids                           K57.30, Diverticulosis of large intestine without                            perforation or abscess without  bleeding CPT copyright 2016 American Medical Association. All rights reserved. The codes documented in this report are preliminary and upon coder review may  be revised to meet current compliance requirements. Cristopher Estimable. Manna Gose, MD Norvel Richards, MD 11/25/2015 8:49:03 AM This report has been signed electronically. Number of Addenda: 0

## 2015-11-25 NOTE — H&P (Signed)
'@LOGO' @   Primary Care Physician:  Glo Herring., MD Primary Gastroenterologist:  Dr. Gala Romney  Pre-Procedure History & Physical: HPI:  Chelsea Ramos is a 75 y.o. female is here for a screening colonoscopy. No bowel symptoms. Left femoral/inguinal hernia. Reported negative colonoscopy in 1980s. No family history of colon cancer.  Past Medical History  Diagnosis Date  . Allergy   . Hypertension   . Osteoporosis   . COPD (chronic obstructive pulmonary disease) ([place])   . Arthritis     Past Surgical History  Procedure Laterality Date  . Dilation and curettage of uterus      X 2  . Left foot surgery      1980's  . Left cataract extraction  1980's  . Right cataract extraction    . Colonoscopy  1980's    Prior to Admission medications   Medication Sig Start Date End Date Taking? Authorizing Provider  aspirin 81 MG tablet Take 81 mg by mouth daily.   Yes Historical Provider, MD  diphenhydrAMINE (EQ ALLERGY) 25 mg capsule Take 25 mg by mouth daily as needed for itching or allergies.   Yes Historical Provider, MD  losartan (COZAAR) 25 MG tablet Take 25 mg by mouth daily.   Yes Historical Provider, MD  Multiple Vitamin (MULTIVITAMIN) capsule Take 1 capsule by mouth daily.   Yes Historical Provider, MD  Na Sulfate-K Sulfate-Mg Sulf (SUPREP BOWEL PREP) SOLN Take 1 kit by mouth as directed. 11/11/15  Yes Daneil Dolin, MD  naproxen (NAPROSYN) 500 MG tablet Take 500 mg by mouth 2 (two) times daily with a meal.   Yes Historical Provider, MD  risedronate (ACTONEL) 150 MG tablet TAKE ONE TABLET BY MOUTH EVERY 30 DAYS WITH WATER ON EMPTY STOMACH, NOTHING BY MOUTH OR LIE DOWN FOR NEXT 30 MINUTES 11/12/14  Yes Florian Buff, MD    Allergies as of 11/11/2015 - Review Complete 11/11/2015  Allergen Reaction Noted  . Prednisone Itching 02/06/2013    Family History  Problem Relation Age of Onset  . Prostate cancer Father   . Diabetes Neg Hx   . Heart disease Mother     Social History    Social History  . Marital Status: Widowed    Spouse Name: N/A  . Number of Children: N/A  . Years of Education: N/A   Occupational History  . Not on file.   Social History Main Topics  . Smoking status: Former Smoker -- 0.50 packs/day for 40 years    Types: Cigarettes  . Smokeless tobacco: Not on file     Comment: cigarettes only.  . Alcohol Use: No  . Drug Use: No  . Sexual Activity: Not on file   Other Topics Concern  . Not on file   Social History Narrative    Review of Systems: See HPI, otherwise negative ROS  Physical Exam: BP 159/81 mmHg  Pulse 118  Temp(Src) 97.8 F (36.6 C) (Oral)  Resp 19  Ht '5\' 1"'  (1.549 m)  Wt 94 lb (42.638 kg)  BMI 17.77 kg/m2  SpO2 99% General:   Alert,   pleasant and cooperative in NAD Lungs:  Clear throughout to auscultation.   No wheezes, crackles, or rhonchi. No acute distress. Heart:  Regular rate and rhythm; no murmurs, clicks, rubs,  or gallops. Abdomen:  Soft, nontender and nondistended. No masses, hepatosplenomegaly or hernias noted. Normal bowel sounds, without guarding, and without rebound.   Msk:  Symmetrical without gross deformities. Normal posture. Pulses:  Normal pulses  noted. Extremities:  Without clubbing or edema.  Impression/Plan: [name] is now here to undergo a screening colonoscopy.  Average risk examination.  Risks, benefits, limitations, imponderables and alternatives regarding colonoscopy have been reviewed with the patient. Questions have been answered. All parties agreeable.     Notice:  This dictation was prepared with Dragon dictation along with smaller phrase technology. Any transcriptional errors that result from this process are unintentional and may not be corrected upon review.

## 2015-12-01 ENCOUNTER — Encounter (HOSPITAL_COMMUNITY): Payer: Self-pay | Admitting: Internal Medicine

## 2015-12-24 ENCOUNTER — Ambulatory Visit (HOSPITAL_COMMUNITY)
Admission: RE | Admit: 2015-12-24 | Discharge: 2015-12-24 | Disposition: A | Discharge status: Home / Self Care | Payer: Medicare HMO | Source: Ambulatory Visit | Attending: Internal Medicine | Admitting: Internal Medicine

## 2015-12-24 ENCOUNTER — Other Ambulatory Visit (HOSPITAL_COMMUNITY): Payer: Self-pay | Admitting: Internal Medicine

## 2015-12-24 DIAGNOSIS — I517 Cardiomegaly: Secondary | ICD-10-CM | POA: Insufficient documentation

## 2015-12-24 DIAGNOSIS — R059 Cough, unspecified: Secondary | ICD-10-CM

## 2015-12-24 DIAGNOSIS — R918 Other nonspecific abnormal finding of lung field: Secondary | ICD-10-CM | POA: Diagnosis not present

## 2015-12-24 DIAGNOSIS — R05 Cough: Secondary | ICD-10-CM | POA: Diagnosis present

## 2016-01-05 ENCOUNTER — Other Ambulatory Visit: Payer: Self-pay | Admitting: Obstetrics & Gynecology

## 2016-11-04 DIAGNOSIS — I10 Essential (primary) hypertension: Secondary | ICD-10-CM | POA: Diagnosis not present

## 2016-11-04 DIAGNOSIS — J449 Chronic obstructive pulmonary disease, unspecified: Secondary | ICD-10-CM | POA: Diagnosis not present

## 2016-11-04 DIAGNOSIS — J343 Hypertrophy of nasal turbinates: Secondary | ICD-10-CM | POA: Diagnosis not present

## 2016-11-04 DIAGNOSIS — Z1389 Encounter for screening for other disorder: Secondary | ICD-10-CM | POA: Diagnosis not present

## 2016-11-04 DIAGNOSIS — J302 Other seasonal allergic rhinitis: Secondary | ICD-10-CM | POA: Diagnosis not present

## 2017-01-11 ENCOUNTER — Other Ambulatory Visit (HOSPITAL_COMMUNITY): Payer: Self-pay | Admitting: Internal Medicine

## 2017-01-11 DIAGNOSIS — Z1231 Encounter for screening mammogram for malignant neoplasm of breast: Secondary | ICD-10-CM

## 2017-01-12 ENCOUNTER — Ambulatory Visit (HOSPITAL_COMMUNITY)
Admission: RE | Admit: 2017-01-12 | Discharge: 2017-01-12 | Disposition: A | Discharge status: Home / Self Care | Payer: PPO | Source: Ambulatory Visit | Attending: Internal Medicine | Admitting: Internal Medicine

## 2017-01-12 DIAGNOSIS — M1991 Primary osteoarthritis, unspecified site: Secondary | ICD-10-CM | POA: Diagnosis not present

## 2017-01-12 DIAGNOSIS — Z0001 Encounter for general adult medical examination with abnormal findings: Secondary | ICD-10-CM | POA: Diagnosis not present

## 2017-01-12 DIAGNOSIS — Z1389 Encounter for screening for other disorder: Secondary | ICD-10-CM | POA: Diagnosis not present

## 2017-01-12 DIAGNOSIS — I10 Essential (primary) hypertension: Secondary | ICD-10-CM | POA: Diagnosis not present

## 2017-01-12 DIAGNOSIS — Z681 Body mass index (BMI) 19 or less, adult: Secondary | ICD-10-CM | POA: Diagnosis not present

## 2017-01-12 DIAGNOSIS — Z1231 Encounter for screening mammogram for malignant neoplasm of breast: Secondary | ICD-10-CM | POA: Diagnosis not present

## 2017-01-12 DIAGNOSIS — J449 Chronic obstructive pulmonary disease, unspecified: Secondary | ICD-10-CM | POA: Diagnosis not present

## 2017-01-12 DIAGNOSIS — J309 Allergic rhinitis, unspecified: Secondary | ICD-10-CM | POA: Diagnosis not present

## 2017-03-29 ENCOUNTER — Ambulatory Visit (INDEPENDENT_AMBULATORY_CARE_PROVIDER_SITE_OTHER): Payer: PPO | Admitting: Obstetrics & Gynecology

## 2017-03-29 ENCOUNTER — Encounter: Payer: Self-pay | Admitting: Obstetrics & Gynecology

## 2017-03-29 VITALS — BP 122/70 | HR 78 | Ht 62.0 in | Wt 95.0 lb

## 2017-03-29 DIAGNOSIS — Z01419 Encounter for gynecological examination (general) (routine) without abnormal findings: Secondary | ICD-10-CM

## 2017-03-29 MED ORDER — RISEDRONATE SODIUM 150 MG PO TABS: ORAL_TABLET | ORAL | 11 refills | Status: DC

## 2017-03-29 NOTE — Progress Notes (Signed)
Subjective:     Chelsea Ramos is a 76 y.o. female here for a routine exam.  No LMP recorded. Patient is postmenopausal. No obstetric history on file. Birth Control Method:  Post menopausal Menstrual Calendar(currently): amenorrheic  Current complaints: hemorrhoid flare up.   Current acute medical issues:  Abdominal wall hernia   Recent Gynecologic History No LMP recorded. Patient is postmenopausal. Last Pap: several years ago,   Last mammogram: 12/2016,  normal  Past Medical History:  Diagnosis Date  . Allergy   . Arthritis   . COPD (chronic obstructive pulmonary disease) ([place])   . Hypertension   . Osteoporosis     Past Surgical History:  Procedure Laterality Date  . COLONOSCOPY  1980's  . COLONOSCOPY N/A 11/25/2015   Procedure: COLONOSCOPY;  Surgeon: Daneil Dolin, MD;  Location: [facility];  Service: Endoscopy;  Laterality: N/A;  8:30 Am  . DILATION AND CURETTAGE OF UTERUS     X 2  . Left cataract extraction  1980's  . Left foot surgery     1980's  . Right cataract extraction      OB History    No data available      Social History   Social History  . Marital status: Widowed    Spouse name: N/A  . Number of children: N/A  . Years of education: N/A   Social History Main Topics  . Smoking status: Former Smoker    Packs/day: 0.50    Years: 40.00    Types: Cigarettes  . Smokeless tobacco: Not on file     Comment: cigarettes only.  . Alcohol use No  . Drug use: No  . Sexual activity: Not on file   Other Topics Concern  . Not on file   Social History Narrative  . No narrative on file    Family History  Problem Relation Age of Onset  . Prostate cancer Father   . Heart disease Mother   . Diabetes Neg Hx      Current Outpatient Prescriptions:  .  amLODipine (NORVASC) 10 MG tablet, Take 10 mg by mouth daily., Disp: , Rfl:  .  aspirin 81 MG tablet, Take 81 mg by mouth daily., Disp: , Rfl:  .  Multiple Vitamin (MULTIVITAMIN) capsule, Take 1 capsule  by mouth daily., Disp: , Rfl:  .  naproxen (NAPROSYN) 500 MG tablet, Take 500 mg by mouth 2 (two) times daily with a meal., Disp: , Rfl:  .  risedronate (ACTONEL) 150 MG tablet, TAKE ONE TABLET BY MOUTH EVERY 30 DAYS WITH WATER ON EMPTY STOMACH, NOTHING BY MOUTH OR LIE DOWN FOR NEXT 30 MINUTES, Disp: 1 tablet, Rfl: 11 .  diphenhydrAMINE (EQ ALLERGY) 25 mg capsule, Take 25 mg by mouth daily as needed for itching or allergies., Disp: , Rfl:  .  losartan (COZAAR) 25 MG tablet, Take 25 mg by mouth daily., Disp: , Rfl:  .  Na Sulfate-K Sulfate-Mg Sulf (SUPREP BOWEL PREP) SOLN, Take 1 kit by mouth as directed. (Patient not taking: Reported on 03/29/2017), Disp: 1 Bottle, Rfl: 0  Review of Systems  Review of Systems  Constitutional: Negative for fever, chills, weight loss, malaise/fatigue and diaphoresis.  HENT: Negative for hearing loss, ear pain, nosebleeds, congestion, sore throat, neck pain, tinnitus and ear discharge.   Eyes: Negative for blurred vision, double vision, photophobia, pain, discharge and redness.  Respiratory: Negative for cough, hemoptysis, sputum production, shortness of breath, wheezing and stridor.   Cardiovascular: Negative for chest  pain, palpitations, orthopnea, claudication, leg swelling and PND.  Gastrointestinal: negative for abdominal pain. Negative for heartburn, nausea, vomiting, diarrhea, constipation, blood in stool and melena.  Genitourinary: Negative for dysuria, urgency, frequency, hematuria and flank pain.  Musculoskeletal: Negative for myalgias, back pain, joint pain and falls.  Skin: Negative for itching and rash.  Neurological: Negative for dizziness, tingling, tremors, sensory change, speech change, focal weakness, seizures, loss of consciousness, weakness and headaches.  Endo/Heme/Allergies: Negative for environmental allergies and polydipsia. Does not bruise/bleed easily.  Psychiatric/Behavioral: Negative for depression, suicidal ideas, hallucinations, memory  loss and substance abuse. The patient is not nervous/anxious and does not have insomnia.        Objective:  Blood pressure 122/70, pulse 78, height '5\' 2"'  (1.575 m), weight 95 lb (43.1 kg).   Physical Exam  Vitals reviewed. Constitutional: She is oriented to person, place, and time. She appears well-developed and well-nourished.  HENT:  Head: Normocephalic and atraumatic.        Right Ear: External ear normal.  Left Ear: External ear normal.  Nose: Nose normal.  Mouth/Throat: Oropharynx is clear and moist.  Eyes: Conjunctivae and EOM are normal. Pupils are equal, round, and reactive to light. Right eye exhibits no discharge. Left eye exhibits no discharge. No scleral icterus.  Neck: Normal range of motion. Neck supple. No tracheal deviation present. No thyromegaly present.  Cardiovascular: Normal rate, regular rhythm, normal heart sounds and intact distal pulses.  Exam reveals no gallop and no friction rub.   No murmur heard. Respiratory: Effort normal and breath sounds normal. No respiratory distress. She has no wheezes. She has no rales. She exhibits no tenderness.  GI: Soft. Bowel sounds are normal. She exhibits no distension and no mass. There is no tenderness. There is no rebound and no guarding.  Genitourinary:  Breasts no masses skin changes or nipple changes bilaterally      Vulva is normal without lesions Vagina is pink moist without discharge Cervix normal in appearance and pap is not done Uterus is normal size shape and contour Adnexa is negative  {Rectal   Deferred due to hemorrhoid, had colonoscopy Musculoskeletal: Normal range of motion. She exhibits no edema and no tenderness.  Neurological: She is alert and oriented to person, place, and time. She has normal reflexes. She displays normal reflexes. No cranial nerve deficit. She exhibits normal muscle tone. Coordination normal.  Skin: Skin is warm and dry. No rash noted. No erythema. No pallor.  Psychiatric: She has a  normal mood and affect. Her behavior is normal. Judgment and thought content normal.       Medications Ordered at today's visit: Meds ordered this encounter  Medications  . amLODipine (NORVASC) 10 MG tablet    Sig: Take 10 mg by mouth daily.  . risedronate (ACTONEL) 150 MG tablet    Sig: TAKE ONE TABLET BY MOUTH EVERY 30 DAYS WITH WATER ON EMPTY STOMACH, NOTHING BY MOUTH OR LIE DOWN FOR NEXT 30 MINUTES    Dispense:  1 tablet    Refill:  11    Other orders placed at today's visit: No orders of the defined types were placed in this encounter.     Assessment:    Healthy female exam.    Plan:    Mammogram ordered. Follow up in: 2 years.     Return in about 2 years (around 03/30/2019) for yearly, with Dr Elonda Husky.

## 2017-06-13 ENCOUNTER — Other Ambulatory Visit: Payer: Self-pay | Admitting: Pharmacy Technician

## 2017-06-13 ENCOUNTER — Other Ambulatory Visit: Payer: Self-pay | Admitting: Pharmacist

## 2017-06-13 NOTE — Patient Outreach (Signed)
[facility]) Care Management  06/13/2017  Chelsea Ramos May 17, 1941 [id number]  Incoming [employer] EMMI call in reference to medication adherence. HIPAA identifiers verified and verbal consent received. Patient was discontinued  on Losartan and it was replaced with Amlodipine. Patient states she takes daily as prescribed and does not have any barriers that would affect her adherence.  Doreene Burke, [place] [phone]

## 2017-06-13 NOTE — Patient Outreach (Signed)
Receive a message from [place] letting me know that Ms. Ciocca reports that her PCP discontinued her losartan and switched her to amlodipine.  Call patient's PCP, Dr. Nolon Rod, office to follow up. Speak with Letta Median, nurse in the office, who reports that per Dr. Nolon Rod note, patient was discontinued off of the losartan due to pruritis and was started on amlodipine 5 mg daily in its place. Update patient's allergy and medication records in Epic accordingly.   Harlow Asa, PharmD, [place] [facility] [phone]

## 2017-08-31 DIAGNOSIS — Z681 Body mass index (BMI) 19 or less, adult: Secondary | ICD-10-CM | POA: Diagnosis not present

## 2017-08-31 DIAGNOSIS — M810 Age-related osteoporosis without current pathological fracture: Secondary | ICD-10-CM | POA: Diagnosis not present

## 2017-08-31 DIAGNOSIS — G894 Chronic pain syndrome: Secondary | ICD-10-CM | POA: Diagnosis not present

## 2017-08-31 DIAGNOSIS — J329 Chronic sinusitis, unspecified: Secondary | ICD-10-CM | POA: Diagnosis not present

## 2017-08-31 DIAGNOSIS — I10 Essential (primary) hypertension: Secondary | ICD-10-CM | POA: Diagnosis not present

## 2017-08-31 DIAGNOSIS — M1991 Primary osteoarthritis, unspecified site: Secondary | ICD-10-CM | POA: Diagnosis not present

## 2017-08-31 DIAGNOSIS — J449 Chronic obstructive pulmonary disease, unspecified: Secondary | ICD-10-CM | POA: Diagnosis not present

## 2017-08-31 DIAGNOSIS — Z1389 Encounter for screening for other disorder: Secondary | ICD-10-CM | POA: Diagnosis not present

## 2017-08-31 DIAGNOSIS — J9801 Acute bronchospasm: Secondary | ICD-10-CM | POA: Diagnosis not present

## 2017-09-05 ENCOUNTER — Other Ambulatory Visit (HOSPITAL_COMMUNITY): Payer: Self-pay | Admitting: Internal Medicine

## 2017-09-05 DIAGNOSIS — E2839 Other primary ovarian failure: Secondary | ICD-10-CM

## 2017-09-13 DIAGNOSIS — Z1384 Encounter for screening for dental disorders: Secondary | ICD-10-CM | POA: Diagnosis not present

## 2017-09-14 ENCOUNTER — Ambulatory Visit (HOSPITAL_COMMUNITY)
Admission: RE | Admit: 2017-09-14 | Discharge: 2017-09-14 | Disposition: A | Discharge status: Home / Self Care | Payer: PPO | Source: Ambulatory Visit | Attending: Internal Medicine | Admitting: Internal Medicine

## 2017-09-14 DIAGNOSIS — Z1389 Encounter for screening for other disorder: Secondary | ICD-10-CM | POA: Diagnosis not present

## 2017-09-14 DIAGNOSIS — E2839 Other primary ovarian failure: Secondary | ICD-10-CM | POA: Diagnosis not present

## 2017-09-14 DIAGNOSIS — M810 Age-related osteoporosis without current pathological fracture: Secondary | ICD-10-CM | POA: Diagnosis not present

## 2017-09-14 DIAGNOSIS — Z780 Asymptomatic menopausal state: Secondary | ICD-10-CM | POA: Diagnosis not present

## 2017-12-15 ENCOUNTER — Other Ambulatory Visit (HOSPITAL_COMMUNITY): Payer: Self-pay | Admitting: Internal Medicine

## 2017-12-15 ENCOUNTER — Ambulatory Visit (HOSPITAL_COMMUNITY)
Admission: RE | Admit: 2017-12-15 | Discharge: 2017-12-15 | Disposition: A | Discharge status: Home / Self Care | Payer: PPO | Source: Ambulatory Visit | Attending: Internal Medicine | Admitting: Internal Medicine

## 2017-12-15 DIAGNOSIS — R0602 Shortness of breath: Secondary | ICD-10-CM | POA: Diagnosis not present

## 2017-12-15 DIAGNOSIS — L03116 Cellulitis of left lower limb: Secondary | ICD-10-CM | POA: Diagnosis not present

## 2017-12-15 DIAGNOSIS — M810 Age-related osteoporosis without current pathological fracture: Secondary | ICD-10-CM | POA: Diagnosis not present

## 2017-12-15 DIAGNOSIS — R059 Cough, unspecified: Secondary | ICD-10-CM

## 2017-12-15 DIAGNOSIS — R05 Cough: Secondary | ICD-10-CM

## 2017-12-15 DIAGNOSIS — M1991 Primary osteoarthritis, unspecified site: Secondary | ICD-10-CM | POA: Diagnosis not present

## 2017-12-15 DIAGNOSIS — Z1389 Encounter for screening for other disorder: Secondary | ICD-10-CM | POA: Diagnosis not present

## 2017-12-15 DIAGNOSIS — J449 Chronic obstructive pulmonary disease, unspecified: Secondary | ICD-10-CM | POA: Diagnosis not present

## 2017-12-15 DIAGNOSIS — Z681 Body mass index (BMI) 19 or less, adult: Secondary | ICD-10-CM | POA: Diagnosis not present

## 2018-01-17 DIAGNOSIS — T1490XA Injury, unspecified, initial encounter: Secondary | ICD-10-CM | POA: Diagnosis not present

## 2018-01-17 DIAGNOSIS — Z681 Body mass index (BMI) 19 or less, adult: Secondary | ICD-10-CM | POA: Diagnosis not present

## 2018-01-17 DIAGNOSIS — L03116 Cellulitis of left lower limb: Secondary | ICD-10-CM | POA: Diagnosis not present

## 2018-01-17 DIAGNOSIS — L97921 Non-pressure chronic ulcer of unspecified part of left lower leg limited to breakdown of skin: Secondary | ICD-10-CM | POA: Diagnosis not present

## 2018-01-17 DIAGNOSIS — Z0000 Encounter for general adult medical examination without abnormal findings: Secondary | ICD-10-CM | POA: Diagnosis not present

## 2018-01-17 DIAGNOSIS — Z1389 Encounter for screening for other disorder: Secondary | ICD-10-CM | POA: Diagnosis not present

## 2018-01-17 DIAGNOSIS — I872 Venous insufficiency (chronic) (peripheral): Secondary | ICD-10-CM | POA: Diagnosis not present

## 2018-01-17 DIAGNOSIS — L97925 Non-pressure chronic ulcer of unspecified part of left lower leg with muscle involvement without evidence of necrosis: Secondary | ICD-10-CM | POA: Diagnosis not present

## 2018-01-17 DIAGNOSIS — Z0001 Encounter for general adult medical examination with abnormal findings: Secondary | ICD-10-CM | POA: Diagnosis not present

## 2018-01-17 DIAGNOSIS — R600 Localized edema: Secondary | ICD-10-CM | POA: Diagnosis not present

## 2018-01-27 ENCOUNTER — Other Ambulatory Visit (HOSPITAL_COMMUNITY): Payer: Self-pay | Admitting: Podiatry

## 2018-01-27 DIAGNOSIS — R52 Pain, unspecified: Secondary | ICD-10-CM | POA: Diagnosis not present

## 2018-01-27 DIAGNOSIS — I83023 Varicose veins of left lower extremity with ulcer of ankle: Secondary | ICD-10-CM | POA: Diagnosis not present

## 2018-01-27 DIAGNOSIS — I739 Peripheral vascular disease, unspecified: Secondary | ICD-10-CM

## 2018-01-31 ENCOUNTER — Ambulatory Visit (HOSPITAL_COMMUNITY)
Admission: RE | Admit: 2018-01-31 | Discharge: 2018-01-31 | Disposition: A | Discharge status: Home / Self Care | Payer: PPO | Source: Ambulatory Visit | Attending: Podiatry | Admitting: Podiatry

## 2018-01-31 DIAGNOSIS — I739 Peripheral vascular disease, unspecified: Secondary | ICD-10-CM | POA: Diagnosis not present

## 2018-01-31 DIAGNOSIS — L97323 Non-pressure chronic ulcer of left ankle with necrosis of muscle: Secondary | ICD-10-CM | POA: Diagnosis not present

## 2018-02-03 DIAGNOSIS — I83023 Varicose veins of left lower extremity with ulcer of ankle: Secondary | ICD-10-CM | POA: Diagnosis not present

## 2018-02-10 DIAGNOSIS — I83023 Varicose veins of left lower extremity with ulcer of ankle: Secondary | ICD-10-CM | POA: Diagnosis not present

## 2018-03-03 DIAGNOSIS — I83023 Varicose veins of left lower extremity with ulcer of ankle: Secondary | ICD-10-CM | POA: Diagnosis not present

## 2018-03-06 DIAGNOSIS — I83023 Varicose veins of left lower extremity with ulcer of ankle: Secondary | ICD-10-CM | POA: Diagnosis not present

## 2018-03-17 DIAGNOSIS — I83023 Varicose veins of left lower extremity with ulcer of ankle: Secondary | ICD-10-CM | POA: Diagnosis not present

## 2018-03-31 DIAGNOSIS — I83023 Varicose veins of left lower extremity with ulcer of ankle: Secondary | ICD-10-CM | POA: Diagnosis not present

## 2018-04-10 DIAGNOSIS — L570 Actinic keratosis: Secondary | ICD-10-CM | POA: Diagnosis not present

## 2018-04-10 DIAGNOSIS — L820 Inflamed seborrheic keratosis: Secondary | ICD-10-CM | POA: Diagnosis not present

## 2018-04-10 DIAGNOSIS — D0439 Carcinoma in situ of skin of other parts of face: Secondary | ICD-10-CM | POA: Diagnosis not present

## 2018-04-10 DIAGNOSIS — X32XXXA Exposure to sunlight, initial encounter: Secondary | ICD-10-CM | POA: Diagnosis not present

## 2018-04-28 DIAGNOSIS — I83023 Varicose veins of left lower extremity with ulcer of ankle: Secondary | ICD-10-CM | POA: Diagnosis not present

## 2018-05-11 DIAGNOSIS — Z85828 Personal history of other malignant neoplasm of skin: Secondary | ICD-10-CM | POA: Diagnosis not present

## 2018-05-11 DIAGNOSIS — L218 Other seborrheic dermatitis: Secondary | ICD-10-CM | POA: Diagnosis not present

## 2018-05-11 DIAGNOSIS — Z08 Encounter for follow-up examination after completed treatment for malignant neoplasm: Secondary | ICD-10-CM | POA: Diagnosis not present

## 2018-06-02 DIAGNOSIS — M25572 Pain in left ankle and joints of left foot: Secondary | ICD-10-CM | POA: Diagnosis not present

## 2018-06-02 DIAGNOSIS — I83023 Varicose veins of left lower extremity with ulcer of ankle: Secondary | ICD-10-CM | POA: Diagnosis not present

## 2018-06-15 DIAGNOSIS — I872 Venous insufficiency (chronic) (peripheral): Secondary | ICD-10-CM | POA: Diagnosis not present

## 2018-06-15 DIAGNOSIS — I10 Essential (primary) hypertension: Secondary | ICD-10-CM | POA: Diagnosis not present

## 2018-06-15 DIAGNOSIS — Z7982 Long term (current) use of aspirin: Secondary | ICD-10-CM | POA: Diagnosis not present

## 2018-06-15 DIAGNOSIS — Z79899 Other long term (current) drug therapy: Secondary | ICD-10-CM | POA: Diagnosis not present

## 2018-06-29 DIAGNOSIS — I872 Venous insufficiency (chronic) (peripheral): Secondary | ICD-10-CM | POA: Diagnosis not present

## 2018-08-01 DIAGNOSIS — L97929 Non-pressure chronic ulcer of unspecified part of left lower leg with unspecified severity: Secondary | ICD-10-CM | POA: Diagnosis not present

## 2018-08-01 DIAGNOSIS — I872 Venous insufficiency (chronic) (peripheral): Secondary | ICD-10-CM | POA: Diagnosis not present

## 2018-08-29 DIAGNOSIS — L97529 Non-pressure chronic ulcer of other part of left foot with unspecified severity: Secondary | ICD-10-CM | POA: Diagnosis not present

## 2018-08-29 DIAGNOSIS — I872 Venous insufficiency (chronic) (peripheral): Secondary | ICD-10-CM | POA: Diagnosis not present

## 2018-09-26 DIAGNOSIS — L97929 Non-pressure chronic ulcer of unspecified part of left lower leg with unspecified severity: Secondary | ICD-10-CM | POA: Diagnosis not present

## 2018-09-26 DIAGNOSIS — I872 Venous insufficiency (chronic) (peripheral): Secondary | ICD-10-CM | POA: Diagnosis not present

## 2018-10-03 DIAGNOSIS — E1165 Type 2 diabetes mellitus with hyperglycemia: Secondary | ICD-10-CM | POA: Diagnosis not present

## 2018-10-03 DIAGNOSIS — K219 Gastro-esophageal reflux disease without esophagitis: Secondary | ICD-10-CM | POA: Diagnosis not present

## 2018-10-03 DIAGNOSIS — I10 Essential (primary) hypertension: Secondary | ICD-10-CM | POA: Diagnosis not present

## 2018-10-03 DIAGNOSIS — Z0000 Encounter for general adult medical examination without abnormal findings: Secondary | ICD-10-CM | POA: Diagnosis not present

## 2018-10-03 DIAGNOSIS — Z0001 Encounter for general adult medical examination with abnormal findings: Secondary | ICD-10-CM | POA: Diagnosis not present

## 2018-10-03 DIAGNOSIS — C61 Malignant neoplasm of prostate: Secondary | ICD-10-CM | POA: Diagnosis not present

## 2018-10-03 DIAGNOSIS — N401 Enlarged prostate with lower urinary tract symptoms: Secondary | ICD-10-CM | POA: Diagnosis not present

## 2018-10-03 DIAGNOSIS — Z13820 Encounter for screening for osteoporosis: Secondary | ICD-10-CM | POA: Diagnosis not present

## 2018-10-03 DIAGNOSIS — R1310 Dysphagia, unspecified: Secondary | ICD-10-CM | POA: Diagnosis not present

## 2018-10-03 DIAGNOSIS — N182 Chronic kidney disease, stage 2 (mild): Secondary | ICD-10-CM | POA: Diagnosis not present

## 2018-10-03 DIAGNOSIS — E785 Hyperlipidemia, unspecified: Secondary | ICD-10-CM | POA: Diagnosis not present

## 2018-10-03 DIAGNOSIS — N200 Calculus of kidney: Secondary | ICD-10-CM | POA: Diagnosis not present

## 2018-10-03 DIAGNOSIS — Z1211 Encounter for screening for malignant neoplasm of colon: Secondary | ICD-10-CM | POA: Diagnosis not present

## 2018-10-03 DIAGNOSIS — R140 Abdominal distension (gaseous): Secondary | ICD-10-CM | POA: Diagnosis not present

## 2018-10-03 DIAGNOSIS — Z139 Encounter for screening, unspecified: Secondary | ICD-10-CM | POA: Diagnosis not present

## 2018-10-03 DIAGNOSIS — M25561 Pain in right knee: Secondary | ICD-10-CM | POA: Diagnosis not present

## 2018-10-03 DIAGNOSIS — Z79899 Other long term (current) drug therapy: Secondary | ICD-10-CM | POA: Diagnosis not present

## 2018-10-03 DIAGNOSIS — I130 Hypertensive heart and chronic kidney disease with heart failure and stage 1 through stage 4 chronic kidney disease, or unspecified chronic kidney disease: Secondary | ICD-10-CM | POA: Diagnosis not present

## 2018-10-03 DIAGNOSIS — M170 Bilateral primary osteoarthritis of knee: Secondary | ICD-10-CM | POA: Diagnosis not present

## 2018-10-03 DIAGNOSIS — E559 Vitamin D deficiency, unspecified: Secondary | ICD-10-CM | POA: Diagnosis not present

## 2018-10-03 DIAGNOSIS — N400 Enlarged prostate without lower urinary tract symptoms: Secondary | ICD-10-CM | POA: Diagnosis not present

## 2018-10-03 DIAGNOSIS — M810 Age-related osteoporosis without current pathological fracture: Secondary | ICD-10-CM | POA: Diagnosis not present

## 2018-10-03 DIAGNOSIS — J309 Allergic rhinitis, unspecified: Secondary | ICD-10-CM | POA: Diagnosis not present

## 2018-10-03 DIAGNOSIS — I5022 Chronic systolic (congestive) heart failure: Secondary | ICD-10-CM | POA: Diagnosis not present

## 2018-10-03 DIAGNOSIS — Z1389 Encounter for screening for other disorder: Secondary | ICD-10-CM | POA: Diagnosis not present

## 2018-10-03 DIAGNOSIS — Z681 Body mass index (BMI) 19 or less, adult: Secondary | ICD-10-CM | POA: Diagnosis not present

## 2018-10-03 DIAGNOSIS — M25562 Pain in left knee: Secondary | ICD-10-CM | POA: Diagnosis not present

## 2018-10-03 DIAGNOSIS — G8929 Other chronic pain: Secondary | ICD-10-CM | POA: Diagnosis not present

## 2018-10-03 DIAGNOSIS — E782 Mixed hyperlipidemia: Secondary | ICD-10-CM | POA: Diagnosis not present

## 2018-10-03 DIAGNOSIS — Z6836 Body mass index (BMI) 36.0-36.9, adult: Secondary | ICD-10-CM | POA: Diagnosis not present

## 2018-10-03 DIAGNOSIS — E538 Deficiency of other specified B group vitamins: Secondary | ICD-10-CM | POA: Diagnosis not present

## 2018-10-10 DIAGNOSIS — L97529 Non-pressure chronic ulcer of other part of left foot with unspecified severity: Secondary | ICD-10-CM | POA: Diagnosis not present

## 2018-10-10 DIAGNOSIS — I872 Venous insufficiency (chronic) (peripheral): Secondary | ICD-10-CM | POA: Diagnosis not present

## 2018-10-11 DIAGNOSIS — L97929 Non-pressure chronic ulcer of unspecified part of left lower leg with unspecified severity: Secondary | ICD-10-CM | POA: Diagnosis not present

## 2018-10-11 DIAGNOSIS — I872 Venous insufficiency (chronic) (peripheral): Secondary | ICD-10-CM | POA: Diagnosis not present

## 2018-10-11 DIAGNOSIS — Z7982 Long term (current) use of aspirin: Secondary | ICD-10-CM | POA: Diagnosis not present

## 2018-10-11 DIAGNOSIS — Z4800 Encounter for change or removal of nonsurgical wound dressing: Secondary | ICD-10-CM | POA: Diagnosis not present

## 2018-10-11 DIAGNOSIS — Z791 Long term (current) use of non-steroidal anti-inflammatories (NSAID): Secondary | ICD-10-CM | POA: Diagnosis not present

## 2018-10-24 DIAGNOSIS — L97929 Non-pressure chronic ulcer of unspecified part of left lower leg with unspecified severity: Secondary | ICD-10-CM | POA: Diagnosis not present

## 2018-10-24 DIAGNOSIS — I872 Venous insufficiency (chronic) (peripheral): Secondary | ICD-10-CM | POA: Diagnosis not present

## 2018-10-27 DIAGNOSIS — I87312 Chronic venous hypertension (idiopathic) with ulcer of left lower extremity: Secondary | ICD-10-CM | POA: Diagnosis not present

## 2018-10-31 DIAGNOSIS — L97929 Non-pressure chronic ulcer of unspecified part of left lower leg with unspecified severity: Secondary | ICD-10-CM | POA: Diagnosis not present

## 2018-10-31 DIAGNOSIS — I872 Venous insufficiency (chronic) (peripheral): Secondary | ICD-10-CM | POA: Diagnosis not present

## 2018-11-01 DIAGNOSIS — L97929 Non-pressure chronic ulcer of unspecified part of left lower leg with unspecified severity: Secondary | ICD-10-CM | POA: Diagnosis not present

## 2018-11-01 DIAGNOSIS — Z7982 Long term (current) use of aspirin: Secondary | ICD-10-CM | POA: Diagnosis not present

## 2018-11-01 DIAGNOSIS — Z4800 Encounter for change or removal of nonsurgical wound dressing: Secondary | ICD-10-CM | POA: Diagnosis not present

## 2018-11-01 DIAGNOSIS — I872 Venous insufficiency (chronic) (peripheral): Secondary | ICD-10-CM | POA: Diagnosis not present

## 2018-11-01 DIAGNOSIS — Z791 Long term (current) use of non-steroidal anti-inflammatories (NSAID): Secondary | ICD-10-CM | POA: Diagnosis not present

## 2018-11-14 DIAGNOSIS — L97929 Non-pressure chronic ulcer of unspecified part of left lower leg with unspecified severity: Secondary | ICD-10-CM | POA: Diagnosis not present

## 2018-11-14 DIAGNOSIS — I872 Venous insufficiency (chronic) (peripheral): Secondary | ICD-10-CM | POA: Diagnosis not present

## 2018-12-26 DIAGNOSIS — Z872 Personal history of diseases of the skin and subcutaneous tissue: Secondary | ICD-10-CM | POA: Diagnosis not present

## 2018-12-26 DIAGNOSIS — I872 Venous insufficiency (chronic) (peripheral): Secondary | ICD-10-CM | POA: Diagnosis not present

## 2019-04-04 DIAGNOSIS — I10 Essential (primary) hypertension: Secondary | ICD-10-CM | POA: Diagnosis not present

## 2019-08-06 DIAGNOSIS — I10 Essential (primary) hypertension: Secondary | ICD-10-CM | POA: Diagnosis not present

## 2019-08-06 DIAGNOSIS — Z681 Body mass index (BMI) 19 or less, adult: Secondary | ICD-10-CM | POA: Diagnosis not present

## 2019-08-10 IMAGING — DX DG CHEST 2V
2 series · 2 of 2 positions shown · non-contrast
Comparison: Chest x-ray dated December 24, 2015.

CLINICAL DATA: Productive cough and congestion since [REDACTED] with
intermittent shortness of breath.

EXAM:
CHEST - 2 VIEW

[chest pa]
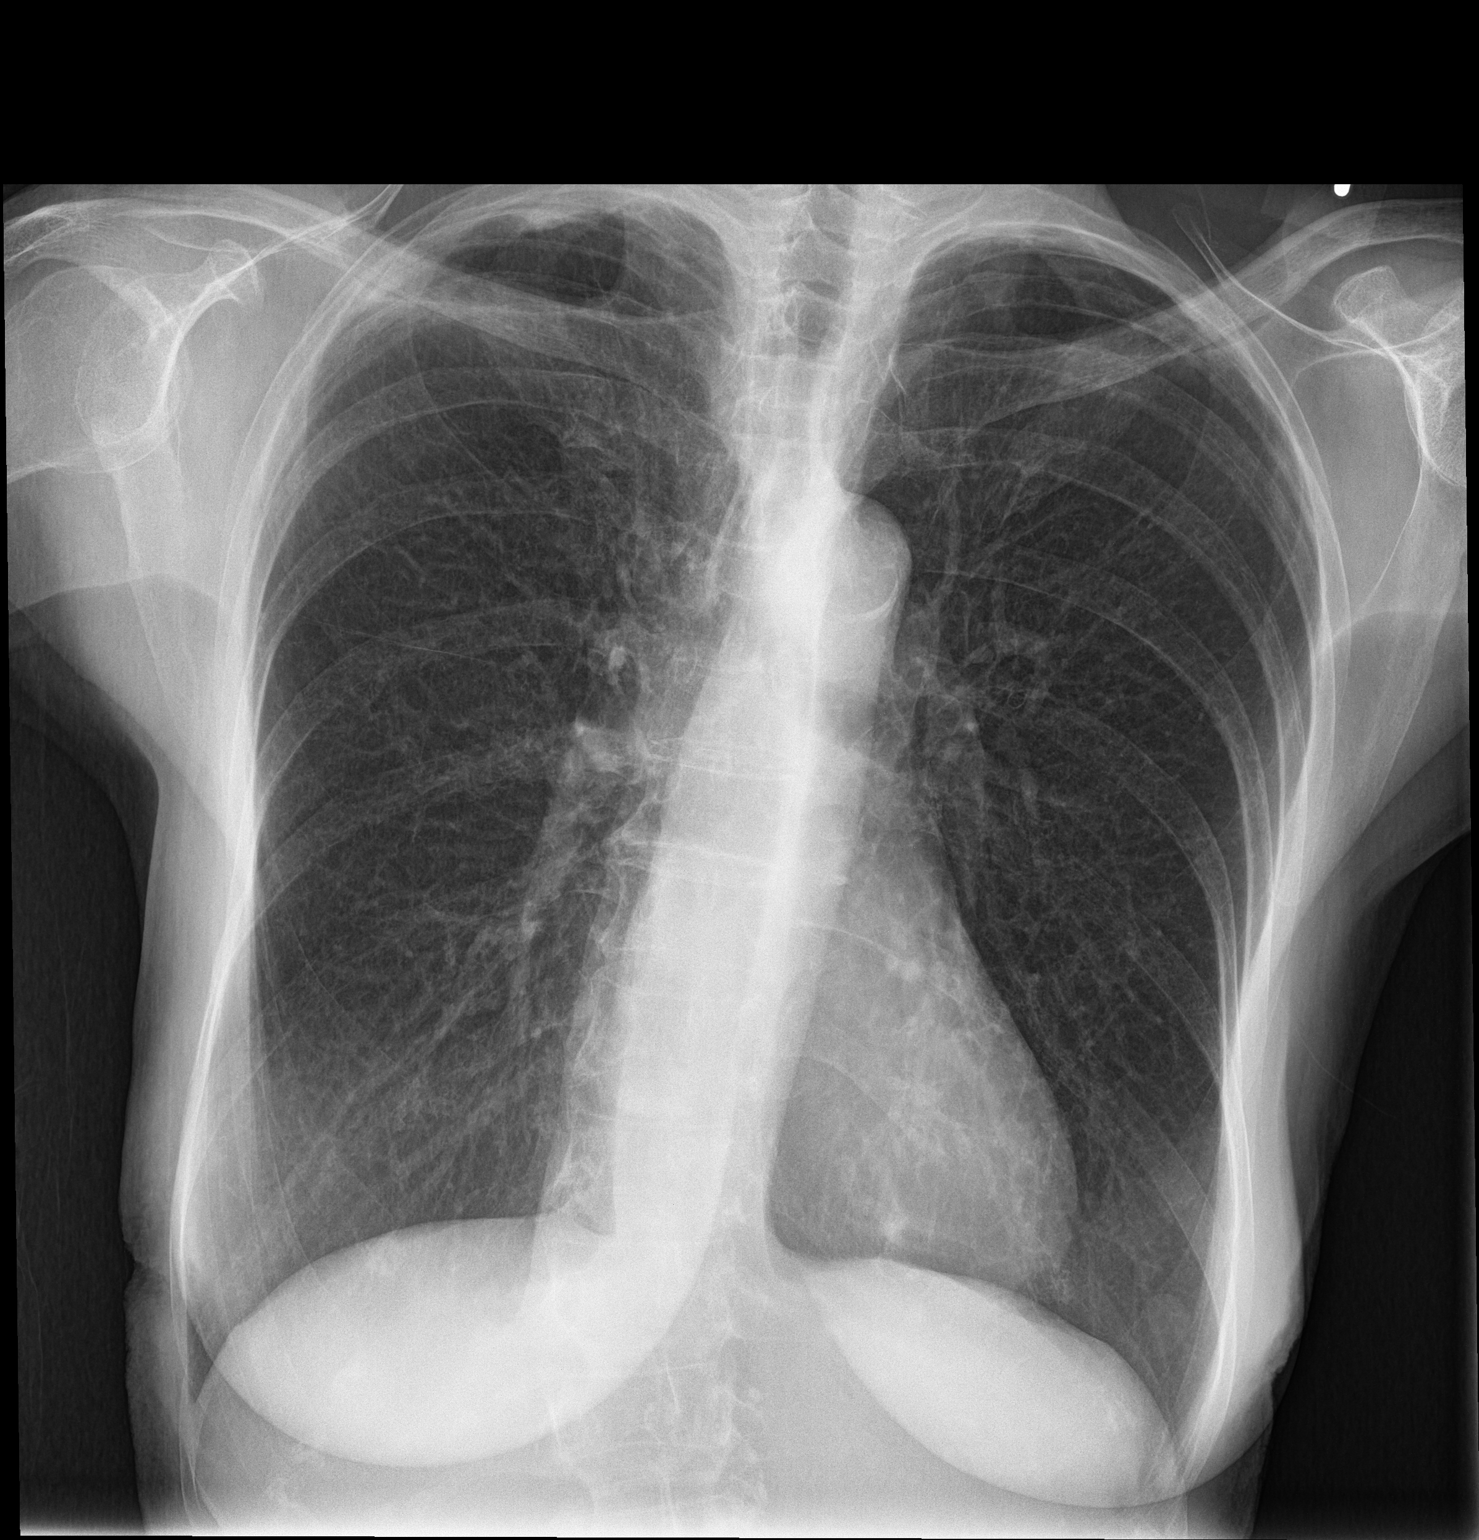

[chest lat]
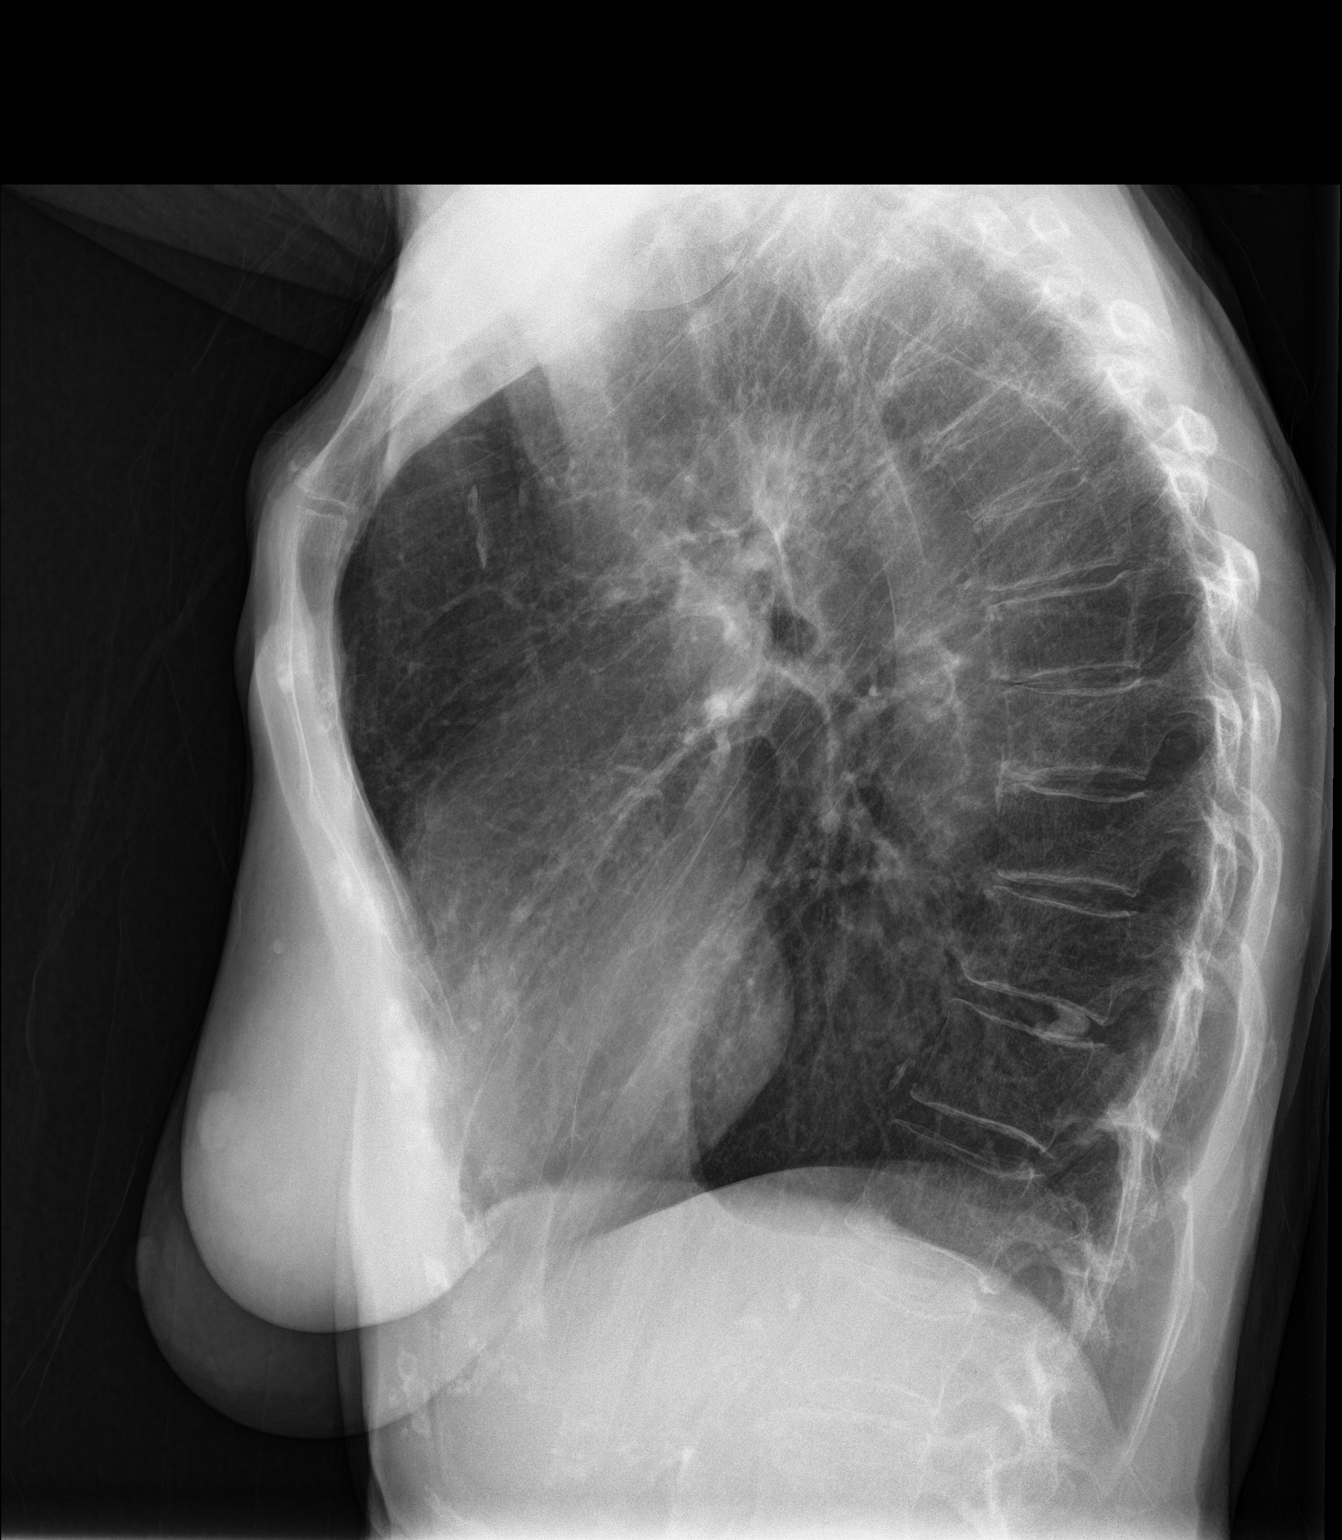

[2 of 2 positions shown; findings below may reference images not displayed]

FINDINGS: The heart size and mediastinal contours are within normal limits.
Normal pulmonary vascularity. No focal consolidation, pleural
effusion, or pneumothorax. Lungs remain hyperinflated with mild
diffuse interstitial prominence and emphysematous changes. No focal
consolidation, pleural effusion, or pneumothorax. No acute osseous
abnormality.
IMPRESSION: COPD.  No active cardiopulmonary disease.

## 2019-10-12 ENCOUNTER — Other Ambulatory Visit (HOSPITAL_COMMUNITY): Payer: Self-pay | Admitting: Internal Medicine

## 2019-10-12 DIAGNOSIS — I10 Essential (primary) hypertension: Secondary | ICD-10-CM | POA: Diagnosis not present

## 2019-10-12 DIAGNOSIS — G894 Chronic pain syndrome: Secondary | ICD-10-CM | POA: Diagnosis not present

## 2019-10-12 DIAGNOSIS — J449 Chronic obstructive pulmonary disease, unspecified: Secondary | ICD-10-CM | POA: Diagnosis not present

## 2019-10-12 DIAGNOSIS — Z1231 Encounter for screening mammogram for malignant neoplasm of breast: Secondary | ICD-10-CM

## 2019-10-12 DIAGNOSIS — Z681 Body mass index (BMI) 19 or less, adult: Secondary | ICD-10-CM | POA: Diagnosis not present

## 2019-10-12 DIAGNOSIS — J302 Other seasonal allergic rhinitis: Secondary | ICD-10-CM | POA: Diagnosis not present

## 2019-10-12 DIAGNOSIS — Z1389 Encounter for screening for other disorder: Secondary | ICD-10-CM | POA: Diagnosis not present

## 2019-10-12 DIAGNOSIS — Z0000 Encounter for general adult medical examination without abnormal findings: Secondary | ICD-10-CM | POA: Diagnosis not present

## 2019-10-12 DIAGNOSIS — Z0001 Encounter for general adult medical examination with abnormal findings: Secondary | ICD-10-CM | POA: Diagnosis not present

## 2019-10-12 DIAGNOSIS — E2839 Other primary ovarian failure: Secondary | ICD-10-CM

## 2019-10-29 ENCOUNTER — Other Ambulatory Visit: Payer: Self-pay

## 2019-10-29 ENCOUNTER — Ambulatory Visit (HOSPITAL_COMMUNITY)
Admission: RE | Admit: 2019-10-29 | Discharge: 2019-10-29 | Disposition: A | Discharge status: Home / Self Care | Payer: PPO | Source: Ambulatory Visit | Attending: Internal Medicine | Admitting: Internal Medicine

## 2019-10-29 DIAGNOSIS — M810 Age-related osteoporosis without current pathological fracture: Secondary | ICD-10-CM | POA: Diagnosis not present

## 2019-10-29 DIAGNOSIS — Z780 Asymptomatic menopausal state: Secondary | ICD-10-CM | POA: Diagnosis not present

## 2019-10-29 DIAGNOSIS — Z1231 Encounter for screening mammogram for malignant neoplasm of breast: Secondary | ICD-10-CM | POA: Diagnosis not present

## 2019-10-29 DIAGNOSIS — E2839 Other primary ovarian failure: Secondary | ICD-10-CM | POA: Diagnosis not present

## 2019-11-26 DIAGNOSIS — M1991 Primary osteoarthritis, unspecified site: Secondary | ICD-10-CM | POA: Diagnosis not present

## 2019-11-26 DIAGNOSIS — I10 Essential (primary) hypertension: Secondary | ICD-10-CM | POA: Diagnosis not present

## 2019-11-26 DIAGNOSIS — M810 Age-related osteoporosis without current pathological fracture: Secondary | ICD-10-CM | POA: Diagnosis not present

## 2019-11-26 DIAGNOSIS — G894 Chronic pain syndrome: Secondary | ICD-10-CM | POA: Diagnosis not present

## 2020-01-25 DIAGNOSIS — M1991 Primary osteoarthritis, unspecified site: Secondary | ICD-10-CM | POA: Diagnosis not present

## 2020-01-25 DIAGNOSIS — G894 Chronic pain syndrome: Secondary | ICD-10-CM | POA: Diagnosis not present

## 2020-01-25 DIAGNOSIS — I10 Essential (primary) hypertension: Secondary | ICD-10-CM | POA: Diagnosis not present

## 2020-01-25 DIAGNOSIS — M810 Age-related osteoporosis without current pathological fracture: Secondary | ICD-10-CM | POA: Diagnosis not present

## 2020-04-03 DIAGNOSIS — L218 Other seborrheic dermatitis: Secondary | ICD-10-CM | POA: Diagnosis not present

## 2020-04-03 DIAGNOSIS — L570 Actinic keratosis: Secondary | ICD-10-CM | POA: Diagnosis not present

## 2020-04-03 DIAGNOSIS — X32XXXD Exposure to sunlight, subsequent encounter: Secondary | ICD-10-CM | POA: Diagnosis not present

## 2020-04-03 DIAGNOSIS — L308 Other specified dermatitis: Secondary | ICD-10-CM | POA: Diagnosis not present

## 2020-05-27 DIAGNOSIS — M810 Age-related osteoporosis without current pathological fracture: Secondary | ICD-10-CM | POA: Diagnosis not present

## 2020-05-27 DIAGNOSIS — J449 Chronic obstructive pulmonary disease, unspecified: Secondary | ICD-10-CM | POA: Diagnosis not present

## 2020-05-27 DIAGNOSIS — I10 Essential (primary) hypertension: Secondary | ICD-10-CM | POA: Diagnosis not present

## 2020-06-12 ENCOUNTER — Ambulatory Visit: Payer: PPO | Attending: Internal Medicine

## 2020-06-12 DIAGNOSIS — Z23 Encounter for immunization: Secondary | ICD-10-CM

## 2020-06-12 NOTE — Progress Notes (Signed)
   Covid-19 Vaccination Clinic  Name:  Chelsea Ramos    MRN: [id number] DOB: 1941-06-28  06/12/2020  [name] was observed post Covid-19 immunization for 15 minutes without incident. She was provided with Vaccine Information Sheet and instruction to access the V-Safe system.   [name] was instructed to call 911 with any severe reactions post vaccine: Marland Kitchen Difficulty breathing  . Swelling of face and throat  . A fast heartbeat  . A bad rash all over body  . Dizziness and weakness   Immunizations Administered    No immunizations on file.

## 2020-07-26 DIAGNOSIS — J449 Chronic obstructive pulmonary disease, unspecified: Secondary | ICD-10-CM | POA: Diagnosis not present

## 2020-07-26 DIAGNOSIS — M810 Age-related osteoporosis without current pathological fracture: Secondary | ICD-10-CM | POA: Diagnosis not present

## 2020-07-26 DIAGNOSIS — I10 Essential (primary) hypertension: Secondary | ICD-10-CM | POA: Diagnosis not present

## 2020-08-19 DIAGNOSIS — Z1331 Encounter for screening for depression: Secondary | ICD-10-CM | POA: Diagnosis not present

## 2020-08-19 DIAGNOSIS — J302 Other seasonal allergic rhinitis: Secondary | ICD-10-CM | POA: Diagnosis not present

## 2020-08-19 DIAGNOSIS — I872 Venous insufficiency (chronic) (peripheral): Secondary | ICD-10-CM | POA: Diagnosis not present

## 2020-08-19 DIAGNOSIS — Z681 Body mass index (BMI) 19 or less, adult: Secondary | ICD-10-CM | POA: Diagnosis not present

## 2020-08-19 DIAGNOSIS — I10 Essential (primary) hypertension: Secondary | ICD-10-CM | POA: Diagnosis not present

## 2020-08-19 DIAGNOSIS — J449 Chronic obstructive pulmonary disease, unspecified: Secondary | ICD-10-CM | POA: Diagnosis not present

## 2020-08-19 DIAGNOSIS — M810 Age-related osteoporosis without current pathological fracture: Secondary | ICD-10-CM | POA: Diagnosis not present

## 2020-08-19 DIAGNOSIS — R609 Edema, unspecified: Secondary | ICD-10-CM | POA: Diagnosis not present

## 2020-08-19 DIAGNOSIS — M1991 Primary osteoarthritis, unspecified site: Secondary | ICD-10-CM | POA: Diagnosis not present

## 2020-08-19 DIAGNOSIS — G894 Chronic pain syndrome: Secondary | ICD-10-CM | POA: Diagnosis not present

## 2020-08-19 DIAGNOSIS — Z0000 Encounter for general adult medical examination without abnormal findings: Secondary | ICD-10-CM | POA: Diagnosis not present

## 2020-09-24 DIAGNOSIS — M810 Age-related osteoporosis without current pathological fracture: Secondary | ICD-10-CM | POA: Diagnosis not present

## 2020-09-24 DIAGNOSIS — J449 Chronic obstructive pulmonary disease, unspecified: Secondary | ICD-10-CM | POA: Diagnosis not present

## 2020-09-24 DIAGNOSIS — I10 Essential (primary) hypertension: Secondary | ICD-10-CM | POA: Diagnosis not present

## 2020-11-25 ENCOUNTER — Other Ambulatory Visit (HOSPITAL_COMMUNITY): Payer: Self-pay | Admitting: Internal Medicine

## 2020-11-25 DIAGNOSIS — I10 Essential (primary) hypertension: Secondary | ICD-10-CM | POA: Diagnosis not present

## 2020-11-25 DIAGNOSIS — Z1231 Encounter for screening mammogram for malignant neoplasm of breast: Secondary | ICD-10-CM

## 2020-11-25 DIAGNOSIS — J449 Chronic obstructive pulmonary disease, unspecified: Secondary | ICD-10-CM | POA: Diagnosis not present

## 2021-01-16 ENCOUNTER — Other Ambulatory Visit: Payer: Self-pay

## 2021-01-16 ENCOUNTER — Ambulatory Visit (HOSPITAL_COMMUNITY)
Admission: RE | Admit: 2021-01-16 | Discharge: 2021-01-16 | Disposition: A | Discharge status: Home / Self Care | Payer: PPO | Source: Ambulatory Visit | Attending: Internal Medicine | Admitting: Internal Medicine

## 2021-01-16 DIAGNOSIS — Z1231 Encounter for screening mammogram for malignant neoplasm of breast: Secondary | ICD-10-CM

## 2021-01-20 DIAGNOSIS — F17290 Nicotine dependence, other tobacco product, uncomplicated: Secondary | ICD-10-CM | POA: Diagnosis not present

## 2021-01-20 DIAGNOSIS — Z681 Body mass index (BMI) 19 or less, adult: Secondary | ICD-10-CM | POA: Diagnosis not present

## 2021-01-20 DIAGNOSIS — Z13220 Encounter for screening for lipoid disorders: Secondary | ICD-10-CM | POA: Diagnosis not present

## 2021-01-20 DIAGNOSIS — J309 Allergic rhinitis, unspecified: Secondary | ICD-10-CM | POA: Diagnosis not present

## 2021-01-20 DIAGNOSIS — J449 Chronic obstructive pulmonary disease, unspecified: Secondary | ICD-10-CM | POA: Diagnosis not present

## 2021-01-20 DIAGNOSIS — M1991 Primary osteoarthritis, unspecified site: Secondary | ICD-10-CM | POA: Diagnosis not present

## 2021-02-25 DIAGNOSIS — J449 Chronic obstructive pulmonary disease, unspecified: Secondary | ICD-10-CM | POA: Diagnosis not present

## 2021-02-25 DIAGNOSIS — I10 Essential (primary) hypertension: Secondary | ICD-10-CM | POA: Diagnosis not present

## 2021-12-03 DIAGNOSIS — H04123 Dry eye syndrome of bilateral lacrimal glands: Secondary | ICD-10-CM | POA: Diagnosis not present

## 2021-12-18 ENCOUNTER — Other Ambulatory Visit (HOSPITAL_COMMUNITY): Payer: Self-pay | Admitting: Internal Medicine

## 2021-12-18 DIAGNOSIS — Z1231 Encounter for screening mammogram for malignant neoplasm of breast: Secondary | ICD-10-CM

## 2021-12-21 DIAGNOSIS — E038 Other specified hypothyroidism: Secondary | ICD-10-CM | POA: Diagnosis not present

## 2021-12-21 DIAGNOSIS — Z1331 Encounter for screening for depression: Secondary | ICD-10-CM | POA: Diagnosis not present

## 2021-12-21 DIAGNOSIS — D518 Other vitamin B12 deficiency anemias: Secondary | ICD-10-CM | POA: Diagnosis not present

## 2021-12-21 DIAGNOSIS — E559 Vitamin D deficiency, unspecified: Secondary | ICD-10-CM | POA: Diagnosis not present

## 2021-12-21 DIAGNOSIS — Z0001 Encounter for general adult medical examination with abnormal findings: Secondary | ICD-10-CM | POA: Diagnosis not present

## 2021-12-21 DIAGNOSIS — Z681 Body mass index (BMI) 19 or less, adult: Secondary | ICD-10-CM | POA: Diagnosis not present

## 2022-01-04 DIAGNOSIS — X32XXXD Exposure to sunlight, subsequent encounter: Secondary | ICD-10-CM | POA: Diagnosis not present

## 2022-01-04 DIAGNOSIS — L570 Actinic keratosis: Secondary | ICD-10-CM | POA: Diagnosis not present

## 2022-01-20 ENCOUNTER — Ambulatory Visit (HOSPITAL_COMMUNITY)
Admission: RE | Admit: 2022-01-20 | Discharge: 2022-01-20 | Disposition: A | Discharge status: Home / Self Care | Payer: PPO | Source: Ambulatory Visit | Attending: Internal Medicine | Admitting: Internal Medicine

## 2022-01-20 DIAGNOSIS — Z1231 Encounter for screening mammogram for malignant neoplasm of breast: Secondary | ICD-10-CM | POA: Diagnosis not present

## 2022-01-21 ENCOUNTER — Other Ambulatory Visit (HOSPITAL_COMMUNITY): Payer: Self-pay | Admitting: Internal Medicine

## 2022-01-21 DIAGNOSIS — Z1231 Encounter for screening mammogram for malignant neoplasm of breast: Secondary | ICD-10-CM

## 2022-03-23 DIAGNOSIS — B351 Tinea unguium: Secondary | ICD-10-CM | POA: Diagnosis not present

## 2022-03-23 DIAGNOSIS — M79675 Pain in left toe(s): Secondary | ICD-10-CM | POA: Diagnosis not present

## 2022-03-23 DIAGNOSIS — M25571 Pain in right ankle and joints of right foot: Secondary | ICD-10-CM | POA: Diagnosis not present

## 2022-03-23 DIAGNOSIS — M79674 Pain in right toe(s): Secondary | ICD-10-CM | POA: Diagnosis not present

## 2022-06-01 DIAGNOSIS — B351 Tinea unguium: Secondary | ICD-10-CM | POA: Diagnosis not present

## 2022-06-09 DIAGNOSIS — B351 Tinea unguium: Secondary | ICD-10-CM | POA: Diagnosis not present

## 2022-06-24 DIAGNOSIS — J209 Acute bronchitis, unspecified: Secondary | ICD-10-CM | POA: Diagnosis not present

## 2022-06-24 DIAGNOSIS — J019 Acute sinusitis, unspecified: Secondary | ICD-10-CM | POA: Diagnosis not present

## 2022-08-17 DIAGNOSIS — B351 Tinea unguium: Secondary | ICD-10-CM | POA: Diagnosis not present

## 2022-08-17 DIAGNOSIS — M25571 Pain in right ankle and joints of right foot: Secondary | ICD-10-CM | POA: Diagnosis not present

## 2022-08-17 DIAGNOSIS — M79671 Pain in right foot: Secondary | ICD-10-CM | POA: Diagnosis not present

## 2022-10-21 ENCOUNTER — Other Ambulatory Visit (HOSPITAL_COMMUNITY): Payer: Self-pay | Admitting: Internal Medicine

## 2022-10-21 DIAGNOSIS — M79604 Pain in right leg: Secondary | ICD-10-CM

## 2022-10-21 DIAGNOSIS — I872 Venous insufficiency (chronic) (peripheral): Secondary | ICD-10-CM | POA: Diagnosis not present

## 2022-10-21 DIAGNOSIS — I10 Essential (primary) hypertension: Secondary | ICD-10-CM | POA: Diagnosis not present

## 2022-10-21 DIAGNOSIS — J0100 Acute maxillary sinusitis, unspecified: Secondary | ICD-10-CM | POA: Diagnosis not present

## 2022-10-21 DIAGNOSIS — M1991 Primary osteoarthritis, unspecified site: Secondary | ICD-10-CM | POA: Diagnosis not present

## 2022-10-21 DIAGNOSIS — Z681 Body mass index (BMI) 19 or less, adult: Secondary | ICD-10-CM | POA: Diagnosis not present

## 2022-10-21 DIAGNOSIS — J449 Chronic obstructive pulmonary disease, unspecified: Secondary | ICD-10-CM | POA: Diagnosis not present

## 2022-10-26 DIAGNOSIS — B351 Tinea unguium: Secondary | ICD-10-CM | POA: Diagnosis not present

## 2022-10-27 ENCOUNTER — Ambulatory Visit (HOSPITAL_COMMUNITY)
Admission: RE | Admit: 2022-10-27 | Discharge: 2022-10-27 | Disposition: A | Discharge status: Home / Self Care | Payer: PPO | Source: Ambulatory Visit | Attending: Internal Medicine | Admitting: Internal Medicine

## 2022-10-27 DIAGNOSIS — M79604 Pain in right leg: Secondary | ICD-10-CM | POA: Diagnosis not present

## 2022-10-27 DIAGNOSIS — M79671 Pain in right foot: Secondary | ICD-10-CM | POA: Diagnosis not present

## 2022-10-27 DIAGNOSIS — M79605 Pain in left leg: Secondary | ICD-10-CM | POA: Insufficient documentation

## 2022-10-27 DIAGNOSIS — M79672 Pain in left foot: Secondary | ICD-10-CM | POA: Diagnosis not present

## 2022-12-06 DIAGNOSIS — H00011 Hordeolum externum right upper eyelid: Secondary | ICD-10-CM | POA: Diagnosis not present

## 2022-12-06 DIAGNOSIS — H43393 Other vitreous opacities, bilateral: Secondary | ICD-10-CM | POA: Diagnosis not present

## 2023-01-18 DIAGNOSIS — B351 Tinea unguium: Secondary | ICD-10-CM | POA: Diagnosis not present

## 2023-02-01 DIAGNOSIS — M5412 Radiculopathy, cervical region: Secondary | ICD-10-CM | POA: Diagnosis not present

## 2023-02-01 DIAGNOSIS — J449 Chronic obstructive pulmonary disease, unspecified: Secondary | ICD-10-CM | POA: Diagnosis not present

## 2023-02-01 DIAGNOSIS — D518 Other vitamin B12 deficiency anemias: Secondary | ICD-10-CM | POA: Diagnosis not present

## 2023-02-01 DIAGNOSIS — Z681 Body mass index (BMI) 19 or less, adult: Secondary | ICD-10-CM | POA: Diagnosis not present

## 2023-02-01 DIAGNOSIS — M810 Age-related osteoporosis without current pathological fracture: Secondary | ICD-10-CM | POA: Diagnosis not present

## 2023-02-01 DIAGNOSIS — I872 Venous insufficiency (chronic) (peripheral): Secondary | ICD-10-CM | POA: Diagnosis not present

## 2023-02-01 DIAGNOSIS — Z1331 Encounter for screening for depression: Secondary | ICD-10-CM | POA: Diagnosis not present

## 2023-02-01 DIAGNOSIS — M1991 Primary osteoarthritis, unspecified site: Secondary | ICD-10-CM | POA: Diagnosis not present

## 2023-02-01 DIAGNOSIS — E038 Other specified hypothyroidism: Secondary | ICD-10-CM | POA: Diagnosis not present

## 2023-02-01 DIAGNOSIS — E559 Vitamin D deficiency, unspecified: Secondary | ICD-10-CM | POA: Diagnosis not present

## 2023-02-01 DIAGNOSIS — Z0001 Encounter for general adult medical examination with abnormal findings: Secondary | ICD-10-CM | POA: Diagnosis not present

## 2023-02-01 DIAGNOSIS — M47812 Spondylosis without myelopathy or radiculopathy, cervical region: Secondary | ICD-10-CM | POA: Diagnosis not present

## 2023-02-01 DIAGNOSIS — H6120 Impacted cerumen, unspecified ear: Secondary | ICD-10-CM | POA: Diagnosis not present

## 2023-02-01 DIAGNOSIS — I10 Essential (primary) hypertension: Secondary | ICD-10-CM | POA: Diagnosis not present

## 2023-02-01 DIAGNOSIS — Z1231 Encounter for screening mammogram for malignant neoplasm of breast: Secondary | ICD-10-CM | POA: Diagnosis not present

## 2023-02-02 ENCOUNTER — Encounter: Payer: Self-pay | Admitting: Adult Health

## 2023-02-02 ENCOUNTER — Ambulatory Visit (INDEPENDENT_AMBULATORY_CARE_PROVIDER_SITE_OTHER): Payer: PPO | Admitting: Adult Health

## 2023-02-02 VITALS — BP 126/70 | HR 101 | Ht 60.0 in | Wt 97.0 lb

## 2023-02-02 DIAGNOSIS — R1031 Right lower quadrant pain: Secondary | ICD-10-CM | POA: Diagnosis not present

## 2023-02-02 DIAGNOSIS — N898 Other specified noninflammatory disorders of vagina: Secondary | ICD-10-CM

## 2023-02-02 DIAGNOSIS — K439 Ventral hernia without obstruction or gangrene: Secondary | ICD-10-CM | POA: Diagnosis not present

## 2023-02-02 DIAGNOSIS — Z1330 Encounter for screening examination for mental health and behavioral disorders, unspecified: Secondary | ICD-10-CM | POA: Diagnosis not present

## 2023-02-02 NOTE — Progress Notes (Signed)
  Subjective:     Patient ID: Chelsea Ramos, female   DOB: 1940-10-12, 82 y.o.   MRN: [id number]  HPI Chelsea Ramos is a 82 year old white female,widowed, PM in complaining of RLQ pain on and off, and dry like vaginal discharge, stringy. Has noticed discharge about a month, no odor or itching. She lives alone and mows her own grass.  Had physical with PCP yesterday and labs.  PCP is Dr Sherwood Gambler.  Review of Systems +RLQ pain and off +vaginal discharge for about a month, dry and stringy Denies any itching or burning Reviewed past medical,surgical, social and family history. Reviewed medications and allergies.     Objective:   Physical Exam BP 126/70 (BP Location: Right Arm, Patient Position: Sitting, Cuff Size: Normal)   Pulse (!) 101   Ht 5' (1.524 m)   Wt 97 lb (44 kg)   BMI 18.94 kg/m     Skin warm and dry.  Lungs: clear to ausculation bilaterally. Cardiovascular: regular rate and rhythm.  Pelvic: external genitalia is normal in appearance no lesions, vagina:pale and dry, no discharge,urethra has no lesions or masses noted, cervix:smooth, uterus: normal size, shape and contour, non tender, no masses felt, adnexa: no masses or tenderness noted. Bladder is non tender and no masses felt. Has left ventral hernia.   AA is 0 Fall risk is moderate    02/02/2023   12:01 PM  Depression screen PHQ 2/9  Decreased Interest 0  Down, Depressed, Hopeless 1  PHQ - 2 Score 1  Altered sleeping 0  Tired, decreased energy 1  Change in appetite 0  Feeling bad or failure about yourself  0  Trouble concentrating 1  Moving slowly or fidgety/restless 1  Suicidal thoughts 0  PHQ-9 Score 4       02/02/2023   12:01 PM  GAD 7 : Generalized Anxiety Score  Nervous, Anxious, on Edge 1  Control/stop worrying 0  Worry too much - different things 1  Trouble relaxing 0  Restless 0  Easily annoyed or irritable 0  Afraid - awful might happen 1  Total GAD 7 Score 3    Upstream - 02/02/23 1151       Pregnancy  Intention Screening   Does the patient want to become pregnant in the next year? N/A    Does the patient's partner want to become pregnant in the next year? N/A    Would the patient like to discuss contraceptive options today? N/A      Contraception Wrap Up   Current Method Abstinence   PM   End Method Abstinence   PM   Contraception Counseling Provided No            Examination chaperoned by Malachy Mood LPN    Assessment:     1. Vaginal discharge Had dry stringy discharge for about a month No discharge today   2. RLQ abdominal pain Pain RLQ on and off Will get pelvic US 02/14/23 at [facility] to assess ovaries, will alk when results back  - US PELVIC COMPLETE WITH TRANSVAGINAL; Future  3. Ventral hernia without obstruction or gangrene     Plan:     Follow up prn

## 2023-02-14 ENCOUNTER — Ambulatory Visit (HOSPITAL_COMMUNITY)
Admission: RE | Admit: 2023-02-14 | Discharge: 2023-02-14 | Disposition: A | Discharge status: Home / Self Care | Payer: PPO | Source: Ambulatory Visit | Attending: Adult Health | Admitting: Adult Health

## 2023-02-14 ENCOUNTER — Other Ambulatory Visit: Payer: Self-pay | Admitting: Adult Health

## 2023-02-14 DIAGNOSIS — N898 Other specified noninflammatory disorders of vagina: Secondary | ICD-10-CM

## 2023-02-14 DIAGNOSIS — R1031 Right lower quadrant pain: Secondary | ICD-10-CM | POA: Diagnosis not present

## 2023-02-14 DIAGNOSIS — N854 Malposition of uterus: Secondary | ICD-10-CM | POA: Diagnosis not present

## 2023-02-14 DIAGNOSIS — K439 Ventral hernia without obstruction or gangrene: Secondary | ICD-10-CM

## 2023-02-18 ENCOUNTER — Telehealth: Payer: Self-pay | Admitting: Adult Health

## 2023-02-18 NOTE — Telephone Encounter (Signed)
No answer

## 2023-02-21 ENCOUNTER — Telehealth: Payer: Self-pay | Admitting: Adult Health

## 2023-02-21 NOTE — Telephone Encounter (Signed)
Pt aware of US results. 

## 2023-02-21 NOTE — Telephone Encounter (Signed)
Left message to call me back about [place]

## 2023-03-09 ENCOUNTER — Other Ambulatory Visit (HOSPITAL_COMMUNITY): Payer: Self-pay | Admitting: Internal Medicine

## 2023-03-09 DIAGNOSIS — Z1231 Encounter for screening mammogram for malignant neoplasm of breast: Secondary | ICD-10-CM

## 2023-03-28 ENCOUNTER — Encounter (HOSPITAL_COMMUNITY): Payer: Self-pay

## 2023-03-28 ENCOUNTER — Ambulatory Visit (HOSPITAL_COMMUNITY)
Admission: RE | Admit: 2023-03-28 | Discharge: 2023-03-28 | Disposition: A | Discharge status: Home / Self Care | Payer: PPO | Source: Ambulatory Visit | Attending: Internal Medicine | Admitting: Internal Medicine

## 2023-03-28 DIAGNOSIS — Z1231 Encounter for screening mammogram for malignant neoplasm of breast: Secondary | ICD-10-CM | POA: Diagnosis not present

## 2023-04-14 DIAGNOSIS — H93292 Other abnormal auditory perceptions, left ear: Secondary | ICD-10-CM | POA: Diagnosis not present

## 2023-04-14 DIAGNOSIS — H6121 Impacted cerumen, right ear: Secondary | ICD-10-CM | POA: Diagnosis not present

## 2023-04-19 DIAGNOSIS — M79674 Pain in right toe(s): Secondary | ICD-10-CM | POA: Diagnosis not present

## 2023-04-19 DIAGNOSIS — B351 Tinea unguium: Secondary | ICD-10-CM | POA: Diagnosis not present

## 2023-05-09 DIAGNOSIS — J069 Acute upper respiratory infection, unspecified: Secondary | ICD-10-CM | POA: Diagnosis not present

## 2023-05-09 DIAGNOSIS — J209 Acute bronchitis, unspecified: Secondary | ICD-10-CM | POA: Diagnosis not present

## 2023-07-01 DIAGNOSIS — H6992 Unspecified Eustachian tube disorder, left ear: Secondary | ICD-10-CM | POA: Diagnosis not present

## 2023-07-01 DIAGNOSIS — H903 Sensorineural hearing loss, bilateral: Secondary | ICD-10-CM | POA: Diagnosis not present

## 2023-07-07 DIAGNOSIS — H6522 Chronic serous otitis media, left ear: Secondary | ICD-10-CM | POA: Diagnosis not present

## 2023-09-15 DIAGNOSIS — M79675 Pain in left toe(s): Secondary | ICD-10-CM | POA: Diagnosis not present

## 2023-09-15 DIAGNOSIS — B351 Tinea unguium: Secondary | ICD-10-CM | POA: Diagnosis not present

## 2023-12-01 DIAGNOSIS — B351 Tinea unguium: Secondary | ICD-10-CM | POA: Diagnosis not present

## 2023-12-01 DIAGNOSIS — M79675 Pain in left toe(s): Secondary | ICD-10-CM | POA: Diagnosis not present

## 2023-12-13 DIAGNOSIS — H04123 Dry eye syndrome of bilateral lacrimal glands: Secondary | ICD-10-CM | POA: Diagnosis not present

## 2024-02-03 DIAGNOSIS — D518 Other vitamin B12 deficiency anemias: Secondary | ICD-10-CM | POA: Diagnosis not present

## 2024-02-03 DIAGNOSIS — E559 Vitamin D deficiency, unspecified: Secondary | ICD-10-CM | POA: Diagnosis not present

## 2024-02-03 DIAGNOSIS — Z0001 Encounter for general adult medical examination with abnormal findings: Secondary | ICD-10-CM | POA: Diagnosis not present

## 2024-02-03 DIAGNOSIS — M47812 Spondylosis without myelopathy or radiculopathy, cervical region: Secondary | ICD-10-CM | POA: Diagnosis not present

## 2024-02-03 DIAGNOSIS — I872 Venous insufficiency (chronic) (peripheral): Secondary | ICD-10-CM | POA: Diagnosis not present

## 2024-02-03 DIAGNOSIS — I10 Essential (primary) hypertension: Secondary | ICD-10-CM | POA: Diagnosis not present

## 2024-02-03 DIAGNOSIS — Z681 Body mass index (BMI) 19 or less, adult: Secondary | ICD-10-CM | POA: Diagnosis not present

## 2024-02-03 DIAGNOSIS — J449 Chronic obstructive pulmonary disease, unspecified: Secondary | ICD-10-CM | POA: Diagnosis not present

## 2024-02-03 DIAGNOSIS — R6889 Other general symptoms and signs: Secondary | ICD-10-CM | POA: Diagnosis not present

## 2024-02-03 DIAGNOSIS — M1991 Primary osteoarthritis, unspecified site: Secondary | ICD-10-CM | POA: Diagnosis not present

## 2024-02-03 DIAGNOSIS — J0100 Acute maxillary sinusitis, unspecified: Secondary | ICD-10-CM | POA: Diagnosis not present

## 2024-02-03 DIAGNOSIS — M810 Age-related osteoporosis without current pathological fracture: Secondary | ICD-10-CM | POA: Diagnosis not present

## 2024-02-03 DIAGNOSIS — G9332 Myalgic encephalomyelitis/chronic fatigue syndrome: Secondary | ICD-10-CM | POA: Diagnosis not present

## 2024-02-03 DIAGNOSIS — Z1331 Encounter for screening for depression: Secondary | ICD-10-CM | POA: Diagnosis not present

## 2024-02-09 DIAGNOSIS — M79675 Pain in left toe(s): Secondary | ICD-10-CM | POA: Diagnosis not present

## 2024-02-09 DIAGNOSIS — M79674 Pain in right toe(s): Secondary | ICD-10-CM | POA: Diagnosis not present

## 2024-02-09 DIAGNOSIS — B351 Tinea unguium: Secondary | ICD-10-CM | POA: Diagnosis not present

## 2024-02-16 ENCOUNTER — Other Ambulatory Visit (HOSPITAL_COMMUNITY): Payer: Self-pay

## 2024-02-16 DIAGNOSIS — Z1231 Encounter for screening mammogram for malignant neoplasm of breast: Secondary | ICD-10-CM

## 2024-03-28 ENCOUNTER — Ambulatory Visit

## 2024-03-30 ENCOUNTER — Ambulatory Visit (HOSPITAL_COMMUNITY)
Admission: RE | Admit: 2024-03-30 | Discharge: 2024-03-30 | Disposition: A | Discharge status: Home / Self Care | Source: Ambulatory Visit

## 2024-03-30 ENCOUNTER — Encounter (HOSPITAL_COMMUNITY): Payer: Self-pay

## 2024-03-30 DIAGNOSIS — Z1231 Encounter for screening mammogram for malignant neoplasm of breast: Secondary | ICD-10-CM | POA: Diagnosis not present

## 2024-04-03 ENCOUNTER — Ambulatory Visit (INDEPENDENT_AMBULATORY_CARE_PROVIDER_SITE_OTHER)

## 2024-04-03 VITALS — BP 157/82 | HR 94 | Ht 60.0 in | Wt 95.1 lb

## 2024-04-03 DIAGNOSIS — I10 Essential (primary) hypertension: Secondary | ICD-10-CM

## 2024-04-03 DIAGNOSIS — Z9622 Myringotomy tube(s) status: Secondary | ICD-10-CM | POA: Diagnosis not present

## 2024-04-03 DIAGNOSIS — J309 Allergic rhinitis, unspecified: Secondary | ICD-10-CM | POA: Diagnosis not present

## 2024-04-03 MED ORDER — AMLODIPINE BESYLATE 5 MG PO TABS: 5.0000 mg | ORAL_TABLET | Freq: Every day | ORAL | 3 refills | Status: AC

## 2024-04-03 NOTE — Progress Notes (Unsigned)
 New Patient Office Visit  Subjective    Patient ID: Chelsea Ramos, female    DOB: 1940-10-11  Age: 83 y.o. MRN: [id number]  CC:  Chief Complaint  Patient presents with   Establish Care    Pt here to Establish care    HPI Chelsea Ramos presents to establish care Discussed the use of AI scribe software for clinical note transcription with the patient, who gave verbal consent to proceed.  History of Present Illness   Chelsea Ramos is an 83 year old female with hypertension who presents with persistent cough and congestion.  Cough and upper respiratory congestion - Persistent cough and congestion despite erythromycin, which has been effective in the past - Symptoms are more pronounced in the mornings and late afternoons  Allergic rhinitis - History of allergies, particularly to ragweed, with worsening symptoms in the fall - Uses over-the-counter Allegra, which helps with runny nose  Hypertension and medication management - Takes amlodipine 5 mg for blood pressure control - Has only one pill of amlodipine remaining - Uses Lasix as needed - Takes vitamins D and C, Aleve for minor pain, and 81 mg aspirin a couple of times a week  Leukopenia - History of low white blood cell count on previous blood test - No follow-up conducted for leukopenia  Otologic symptoms - History of ear issues with tube placement in January due to fluid and hearing problems - Improved hearing following tube placement  Peripheral edema and ankle arthralgia - Swelling in the ankle, particularly in the afternoons - 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   aspirin 81 MG tablet Take 81 mg by mouth daily.    Cholecalciferol (VITAMIN D-3 PO) Take by mouth.   diphenhydrAMINE (EQ ALLERGY) 25 mg capsule Take 25 mg by mouth daily as needed for itching or allergies.   furosemide (LASIX) 20 MG tablet Take 20 mg by mouth daily.   LUTEIN PO Take by mouth.   Multiple Vitamin (MULTIVITAMIN) capsule Take 2 capsules by mouth daily.   risedronate  (ACTONEL ) 150 MG tablet TAKE ONE TABLET BY MOUTH EVERY 30 DAYS WITH WATER  ON EMPTY STOMACH, NOTHING BY MOUTH OR LIE DOWN FOR NEXT 30 MINUTES (Patient taking differently: TAKE ONE TABLET BY MOUTH WEEKLY WITH WATER  ON EMPTY STOMACH, NOTHING BY MOUTH OR LIE DOWN FOR NEXT 30 MINUTES)   [DISCONTINUED] amLODipine (NORVASC) 5 MG tablet Take 5 mg by mouth daily.   [DISCONTINUED] montelukast (SINGULAIR) 10 MG tablet Take 10 mg by mouth at bedtime.   amLODipine (NORVASC) 5 MG tablet Take 1 tablet (5 mg total) by mouth daily.   No facility-administered encounter medications on file as of 04/03/2024.    Past Medical History:  Diagnosis Date   Allergy    Arthritis    COPD (chronic obstructive pulmonary disease) (HCC)    Hernia of flank    Hypertension    Osteoporosis     Past Surgical History:  Procedure Laterality Date   COLONOSCOPY  1980's   COLONOSCOPY N/A 11/25/2015   Procedure: COLONOSCOPY;  Surgeon: Lamar CHRISTELLA Hollingshead, MD;  Location: [facility];  Service: Endoscopy;  Laterality: N/A;  8:30 Am   DILATION AND  CURETTAGE OF UTERUS     X 2   Left cataract extraction  1980's   Left foot surgery     1980's   Right cataract extraction      Family History  Problem Relation Age of Onset   Prostate cancer Father    Diabetes Mother    Heart disease Mother    Heart attack Brother    Heart disease Brother    Alcohol abuse Brother    Heart disease Brother    Heart attack Brother    Arthritis Sister    Arthritis Sister     Social History   Socioeconomic History   Marital status: Widowed    Spouse name: Not on file   Number of children: Not on file   Years of  education: Not on file   Highest education level: Not on file  Occupational History   Not on file  Tobacco Use   Smoking status: Every Day    Current packs/day: 0.50    Average packs/day: 0.5 packs/day for 40.0 years (20.0 ttl pk-yrs)    Types: Cigarettes   Smokeless tobacco: Never   Tobacco comments:    cigarettes only.  Vaping Use   Vaping status: Former  Substance and Sexual Activity   Alcohol use: No   Drug use: No   Sexual activity: Not Currently    Birth control/protection: Post-menopausal, Abstinence  Other Topics Concern   Not on file  Social History Narrative   Not on file   Social Drivers of Health   Financial Resource Strain: Low Risk  (02/02/2023)   Overall Financial Resource Strain (CARDIA)    Difficulty of Paying Living Expenses: Not hard at all  Food Insecurity: No Food Insecurity (02/02/2023)   Hunger Vital Sign    Worried About Running Out of Food in the Last Year: Never true    Ran Out of Food in the Last Year: Never true  Transportation Needs: No Transportation Needs (02/02/2023)   PRAPARE - Administrator, Civil Service (Medical): No    Lack of Transportation (Non-Medical): No  Physical Activity: Insufficiently Active (02/02/2023)   Exercise Vital Sign    Days of Exercise per Week: 2 days    Minutes of Exercise per Session: 20 min  Stress: No Stress Concern Present (02/02/2023)   [employer] of Occupational Health - Occupational Stress Questionnaire    Feeling of Stress : Only a little  Social Connections: Moderately Isolated (02/02/2023)   Social Connection and Isolation Panel    Frequency of Communication with Friends and Family: More than three times a week    Frequency of Social Gatherings with Friends and Family: Twice a week    Attends Religious Services: More than 4 times per year    Active Member of [employer] or Organizations: No    Attends Banker Meetings: Never    Marital Status: Widowed  Intimate Partner Violence: Not  At Risk (02/02/2023)   Humiliation, Afraid, Rape, and Kick questionnaire    Fear of Current or Ex-Partner: No    Emotionally Abused: No    Physically Abused: No    Sexually Abused: No    ROS      Objective    BP (!) 157/82   Pulse 94   Ht 5' (1.524 m)   Wt 95 lb 1.9 oz (43.1 kg)   SpO2 91%   BMI 18.58 kg/m   Physical Exam Vitals and nursing note reviewed.  Constitutional:  Appearance: Normal appearance.  HENT:     Head: Normocephalic.     Right Ear: Tympanic membrane, ear canal and external ear normal. A PE tube is present.     Left Ear: Tympanic membrane, ear canal and external ear normal.     Nose: Nose normal.     Mouth/Throat:     Mouth: Mucous membranes are moist.     Pharynx: Oropharynx is clear.  Eyes:     Extraocular Movements: Extraocular movements intact.     Pupils: Pupils are equal, round, and reactive to light.  Cardiovascular:     Rate and Rhythm: Normal rate and regular rhythm.  Pulmonary:     Effort: Pulmonary effort is normal.     Breath sounds: Normal breath sounds.  Musculoskeletal:     Cervical back: Normal range of motion and neck supple.  Skin:    General: Skin is warm and dry.  Neurological:     Mental Status: She is alert and oriented to person, place, and time.  Psychiatric:        Mood and Affect: Mood normal.        Thought Content: Thought content normal.         Assessment & Plan:   Problem List Items Addressed This Visit       Cardiovascular and Mediastinum   Hypertension - Primary (Chronic)   Essential hypertension requiring ongoing management. With amlodipine 5 mg, 90-day supply, to [facility] [employer].      Relevant Medications   amLODipine (NORVASC) 5 MG tablet     Respiratory   Allergic rhinitis (Chronic)   Persistent cough and congestion likely due to allergic rhinitis. - Recommend Mucinex for mucus thinning. - Advise increased water  intake.        Other   S/P tympanic tube insertion   Hearing loss  in the right ear, improved after tympanostomy tube placement in January.       Return in about 6 months (around 10/02/2024) for chronic follow-up with PCP.   Leita Longs, FNP

## 2024-04-06 DIAGNOSIS — Z9622 Myringotomy tube(s) status: Secondary | ICD-10-CM | POA: Insufficient documentation

## 2024-04-06 DIAGNOSIS — J309 Allergic rhinitis, unspecified: Secondary | ICD-10-CM | POA: Insufficient documentation

## 2024-04-06 NOTE — Assessment & Plan Note (Signed)
 Hearing loss in the right ear, improved after tympanostomy tube placement in January.

## 2024-04-06 NOTE — Assessment & Plan Note (Signed)
 Persistent cough and congestion likely due to allergic rhinitis. - Recommend Mucinex for mucus thinning. - Advise increased water  intake.

## 2024-04-06 NOTE — Assessment & Plan Note (Signed)
 Essential hypertension requiring ongoing management. With amlodipine 5 mg, 90-day supply, to [facility] [employer].

## 2024-05-07 ENCOUNTER — Inpatient Hospital Stay (HOSPITAL_COMMUNITY)

## 2024-05-07 ENCOUNTER — Other Ambulatory Visit: Payer: Self-pay

## 2024-05-07 ENCOUNTER — Encounter (HOSPITAL_COMMUNITY): Payer: Self-pay | Admitting: Emergency Medicine

## 2024-05-07 ENCOUNTER — Emergency Department (HOSPITAL_COMMUNITY)

## 2024-05-07 ENCOUNTER — Inpatient Hospital Stay (HOSPITAL_COMMUNITY)
Admission: EM | Admit: 2024-05-07 | Discharge: 2024-05-10 | DRG: 522 | Disposition: A | Discharge status: Skilled Nursing Facility | Attending: Family Medicine | Admitting: Family Medicine

## 2024-05-07 DIAGNOSIS — S72009A Fracture of unspecified part of neck of unspecified femur, initial encounter for closed fracture: Secondary | ICD-10-CM | POA: Diagnosis not present

## 2024-05-07 DIAGNOSIS — W19XXXA Unspecified fall, initial encounter: Secondary | ICD-10-CM | POA: Diagnosis not present

## 2024-05-07 DIAGNOSIS — Z9841 Cataract extraction status, right eye: Secondary | ICD-10-CM | POA: Diagnosis not present

## 2024-05-07 DIAGNOSIS — S72001D Fracture of unspecified part of neck of right femur, subsequent encounter for closed fracture with routine healing: Secondary | ICD-10-CM | POA: Diagnosis not present

## 2024-05-07 DIAGNOSIS — M810 Age-related osteoporosis without current pathological fracture: Secondary | ICD-10-CM | POA: Diagnosis not present

## 2024-05-07 DIAGNOSIS — Z8249 Family history of ischemic heart disease and other diseases of the circulatory system: Secondary | ICD-10-CM | POA: Diagnosis not present

## 2024-05-07 DIAGNOSIS — J449 Chronic obstructive pulmonary disease, unspecified: Secondary | ICD-10-CM | POA: Diagnosis present

## 2024-05-07 DIAGNOSIS — W010XXA Fall on same level from slipping, tripping and stumbling without subsequent striking against object, initial encounter: Secondary | ICD-10-CM | POA: Diagnosis present

## 2024-05-07 DIAGNOSIS — R42 Dizziness and giddiness: Secondary | ICD-10-CM | POA: Diagnosis not present

## 2024-05-07 DIAGNOSIS — I10 Essential (primary) hypertension: Secondary | ICD-10-CM | POA: Diagnosis not present

## 2024-05-07 DIAGNOSIS — M25551 Pain in right hip: Secondary | ICD-10-CM | POA: Diagnosis not present

## 2024-05-07 DIAGNOSIS — Z8261 Family history of arthritis: Secondary | ICD-10-CM | POA: Diagnosis not present

## 2024-05-07 DIAGNOSIS — Z888 Allergy status to other drugs, medicaments and biological substances status: Secondary | ICD-10-CM | POA: Diagnosis not present

## 2024-05-07 DIAGNOSIS — S72001A Fracture of unspecified part of neck of right femur, initial encounter for closed fracture: Principal | ICD-10-CM | POA: Diagnosis present

## 2024-05-07 DIAGNOSIS — Z9842 Cataract extraction status, left eye: Secondary | ICD-10-CM

## 2024-05-07 DIAGNOSIS — Z471 Aftercare following joint replacement surgery: Secondary | ICD-10-CM | POA: Diagnosis not present

## 2024-05-07 DIAGNOSIS — Y9301 Activity, walking, marching and hiking: Secondary | ICD-10-CM | POA: Diagnosis present

## 2024-05-07 DIAGNOSIS — Z79899 Other long term (current) drug therapy: Secondary | ICD-10-CM

## 2024-05-07 DIAGNOSIS — S72031A Displaced midcervical fracture of right femur, initial encounter for closed fracture: Secondary | ICD-10-CM | POA: Diagnosis not present

## 2024-05-07 DIAGNOSIS — M8580 Other specified disorders of bone density and structure, unspecified site: Secondary | ICD-10-CM | POA: Diagnosis not present

## 2024-05-07 DIAGNOSIS — J439 Emphysema, unspecified: Secondary | ICD-10-CM | POA: Diagnosis not present

## 2024-05-07 DIAGNOSIS — S72002A Fracture of unspecified part of neck of left femur, initial encounter for closed fracture: Secondary | ICD-10-CM | POA: Diagnosis not present

## 2024-05-07 DIAGNOSIS — I6523 Occlusion and stenosis of bilateral carotid arteries: Secondary | ICD-10-CM | POA: Diagnosis not present

## 2024-05-07 DIAGNOSIS — F17210 Nicotine dependence, cigarettes, uncomplicated: Secondary | ICD-10-CM | POA: Diagnosis present

## 2024-05-07 DIAGNOSIS — R531 Weakness: Secondary | ICD-10-CM | POA: Diagnosis not present

## 2024-05-07 DIAGNOSIS — Z833 Family history of diabetes mellitus: Secondary | ICD-10-CM | POA: Diagnosis not present

## 2024-05-07 DIAGNOSIS — M25559 Pain in unspecified hip: Secondary | ICD-10-CM | POA: Diagnosis not present

## 2024-05-07 DIAGNOSIS — Z96641 Presence of right artificial hip joint: Secondary | ICD-10-CM | POA: Diagnosis not present

## 2024-05-07 DIAGNOSIS — Y92009 Unspecified place in unspecified non-institutional (private) residence as the place of occurrence of the external cause: Secondary | ICD-10-CM

## 2024-05-07 DIAGNOSIS — Z7982 Long term (current) use of aspirin: Secondary | ICD-10-CM | POA: Diagnosis not present

## 2024-05-07 LAB — TYPE AND SCREEN
ABO/RH(D): O POS
Antibody Screen: NEGATIVE

## 2024-05-07 LAB — CBC WITH DIFFERENTIAL/PLATELET
Abs Immature Granulocytes: 0.06 K/uL (ref 0.00–0.07)
Basophils Absolute: 0 K/uL (ref 0.0–0.1)
Basophils Relative: 0 %
Eosinophils Absolute: 0 K/uL (ref 0.0–0.5)
Eosinophils Relative: 0 %
HCT: 39.4 % (ref 36.0–46.0)
Hemoglobin: 12.6 g/dL (ref 12.0–15.0)
Immature Granulocytes: 1 %
Lymphocytes Relative: 18 %
Lymphs Abs: 0.9 K/uL (ref 0.7–4.0)
MCH: 30.1 pg (ref 26.0–34.0)
MCHC: 32 g/dL (ref 30.0–36.0)
MCV: 94 fL (ref 80.0–100.0)
Monocytes Absolute: 0.4 K/uL (ref 0.1–1.0)
Monocytes Relative: 9 %
Neutro Abs: 3.4 K/uL (ref 1.7–7.7)
Neutrophils Relative %: 72 %
Platelets: 171 K/uL (ref 150–400)
RBC: 4.19 MIL/uL (ref 3.87–5.11)
RDW: 17 % — ABNORMAL HIGH (ref 11.5–15.5)
WBC: 4.8 K/uL (ref 4.0–10.5)
nRBC: 0 % (ref 0.0–0.2)

## 2024-05-07 LAB — BASIC METABOLIC PANEL WITH GFR
Anion gap: 12 (ref 5–15)
BUN: 21 mg/dL (ref 8–23)
CO2: 23 mmol/L (ref 22–32)
Calcium: 9.3 mg/dL (ref 8.9–10.3)
Chloride: 106 mmol/L (ref 98–111)
Creatinine, Ser: 0.7 mg/dL (ref 0.44–1.00)
GFR, Estimated: >60 mL/min (ref >60)
Glucose, Bld: 101 mg/dL — ABNORMAL HIGH (ref 70–99)
Potassium: 4.2 mmol/L (ref 3.5–5.1)
Sodium: 142 mmol/L (ref 135–145)

## 2024-05-07 LAB — PROTIME-INR
INR: 0.9 (ref 0.8–1.2)
Prothrombin Time: 13 s (ref 11.4–15.2)

## 2024-05-07 MED ORDER — ACETAMINOPHEN 650 MG RE SUPP: 650.0000 mg | Freq: Four times a day (QID) | RECTAL | Status: DC | PRN

## 2024-05-07 MED ORDER — IPRATROPIUM-ALBUTEROL 0.5-2.5 (3) MG/3ML IN SOLN: 3.0000 mL | RESPIRATORY_TRACT | Status: DC | PRN

## 2024-05-07 MED ORDER — ONDANSETRON HCL 4 MG/2ML IJ SOLN
4.0000 mg | Freq: Four times a day (QID) | INTRAMUSCULAR | Status: DC | PRN
  Administered 2024-05-08: 4 mg via INTRAVENOUS
  Filled 2024-05-07: qty 2

## 2024-05-07 MED ORDER — ONDANSETRON HCL 4 MG PO TABS: 4.0000 mg | ORAL_TABLET | Freq: Four times a day (QID) | ORAL | Status: DC | PRN

## 2024-05-07 MED ORDER — LORAZEPAM 0.5 MG PO TABS
0.2500 mg | ORAL_TABLET | Freq: Three times a day (TID) | ORAL | Status: DC | PRN
Start: 2024-05-07 — End: 2024-05-10

## 2024-05-07 MED ORDER — FENTANYL CITRATE (PF) 100 MCG/2ML IJ SOLN
50.0000 ug | INTRAMUSCULAR | Status: DC | PRN
  Administered 2024-05-07: 50 ug via INTRAVENOUS
  Filled 2024-05-07 (×2): qty 2

## 2024-05-07 MED ORDER — MORPHINE SULFATE (PF) 2 MG/ML IV SOLN
2.0000 mg | INTRAVENOUS | Status: DC | PRN
  Administered 2024-05-07 (×2): 2 mg via INTRAVENOUS
  Filled 2024-05-07 (×2): qty 1

## 2024-05-07 MED ORDER — ACETAMINOPHEN 325 MG PO TABS: 650.0000 mg | ORAL_TABLET | Freq: Four times a day (QID) | ORAL | Status: DC | PRN

## 2024-05-07 MED ORDER — AMLODIPINE BESYLATE 5 MG PO TABS
5.0000 mg | ORAL_TABLET | Freq: Every day | ORAL | Status: DC
  Administered 2024-05-10: 5 mg via ORAL
  Filled 2024-05-07 (×3): qty 1

## 2024-05-07 MED ORDER — POLYETHYLENE GLYCOL 3350 17 G PO PACK
17.0000 g | PACK | Freq: Every day | ORAL | Status: DC | PRN
Start: 2024-05-07 — End: 2024-05-08

## 2024-05-07 MED ORDER — LACTATED RINGERS IV SOLN: INTRAVENOUS | Status: DC

## 2024-05-07 NOTE — ED Triage Notes (Signed)
 Pt arrived via [facility] from home c/o a fall while walking to fast to the kitchen to answer the phone that was ringing and fell forward and landed on R hip , pt states she is unsure if she hit her head but denies LOC and denies use of blood thinners. C/o R hip pain, some shortening and rotation noted to R leg. Rates pain 6/10 while not moving and 9/10 when moving

## 2024-05-07 NOTE — ED Provider Notes (Signed)
 [place] EMERGENCY DEPARTMENT AT [facility] Provider Note   CSN: [id number] Arrival date & time: 05/07/24  1424     Patient presents with: Chelsea Ramos is a 83 y.o. female.    Fall  Patient presents after fall.  Tripped while walking quickly to go answer the phone.  Landed on right hip.  Complaining only of right hip pain.  Would not be able to ambulate this time.  Potentially hit head but no loss conscious and no head or neck pain at this time.  Not on blood thinners.    . Past Medical History:  Diagnosis Date   Allergy    Arthritis    COPD (chronic obstructive pulmonary disease) (HCC)    Hernia of flank    Hypertension    Osteoporosis     Prior to Admission medications   Medication Sig Start Date End Date Taking? Authorizing Provider  amLODipine (NORVASC) 5 MG tablet Take 1 tablet (5 mg total) by mouth daily. 04/03/24   Bevely Doffing, FNP  Ascorbic Acid (VITAMIN C PO) Take by mouth.    [provider]  aspirin 81 MG tablet Take 81 mg by mouth daily.    [provider]  Cholecalciferol (VITAMIN D-3 PO) Take by mouth.    [provider]  diphenhydrAMINE (EQ ALLERGY) 25 mg capsule Take 25 mg by mouth daily as needed for itching or allergies.    [provider]  furosemide (LASIX) 20 MG tablet Take 20 mg by mouth daily.    [provider]  LUTEIN PO Take by mouth.    [provider]  Multiple Vitamin (MULTIVITAMIN) capsule Take 2 capsules by mouth daily.    [provider]  risedronate  (ACTONEL ) 150 MG tablet TAKE ONE TABLET BY MOUTH EVERY 30 DAYS WITH WATER  ON EMPTY STOMACH, NOTHING BY MOUTH OR LIE DOWN FOR NEXT 30 MINUTES Patient taking differently: TAKE ONE TABLET BY MOUTH WEEKLY WITH WATER  ON EMPTY STOMACH, NOTHING BY MOUTH OR LIE DOWN FOR NEXT 30 MINUTES 03/29/17   Jayne Vonn DEL, MD    Allergies: Losartan and Prednisone    Review of Systems  Updated Vital Signs BP (!) 149/90 (BP  Location: Left Arm)   Pulse (!) 103   Temp 97.6 F (36.4 C) (Oral)   Resp 18   SpO2 96%   Physical Exam Vitals and nursing note reviewed.  HENT:     Head: Atraumatic.  Cardiovascular:     Rate and Rhythm: Regular rhythm.  Abdominal:     Tenderness: There is no abdominal tenderness.  Musculoskeletal:        General: Tenderness present.     Cervical back: Neck supple.     Comments: Tenderness to right hip.  Decreased range of motion.  No lumbar tenderness. Pelvis is stable.  No knee tenderness.  Neurological:     Mental Status: She is alert.     (all labs ordered are listed, but only abnormal results are displayed) Labs Reviewed  BASIC METABOLIC PANEL WITH GFR - Abnormal; Notable for the following components:      Result Value   Glucose, Bld 101 (*)    All other components within normal limits  CBC WITH DIFFERENTIAL/PLATELET - Abnormal; Notable for the following components:   RDW 17.0 (*)    All other components within normal limits  PROTIME-INR  TYPE AND SCREEN    EKG: None  Radiology: DG Chest 1 View Result Date: 05/07/2024 EXAM: 1  VIEW(S) XRAY OF THE CHEST 05/07/2024 03:16:00 PM COMPARISON: 12/17/2017 CLINICAL HISTORY: hip pain after fall FINDINGS: LUNGS AND PLEURA: Emphysema. No focal pulmonary opacity. No pulmonary edema. No pleural effusion. No pneumothorax. HEART AND MEDIASTINUM: No acute abnormality of the cardiac and mediastinal silhouettes. BONES AND SOFT TISSUES: Minimal dextrocurvature of the Thoracic spine. IMPRESSION: 1. Emphysema. No acute cardiopulmonary abnormality. Electronically signed by: Rogelia Myers MD 05/07/2024 03:51 PM EST RP Workstation: [id number]   DG Hip Unilat With Pelvis 2-3 Views Right Result Date: 05/07/2024 EXAM: 2 OR MORE VIEW(S) XRAY OF THE RIGHT HIP 05/07/2024 03:16:00 PM COMPARISON: None available. CLINICAL HISTORY: hip pain after fall FINDINGS: BONES AND JOINTS: Mildly impacted, superiorly displaced transcervical right femoral neck  fracture with varus angulation. No dislocation. No associated pelvic bone fracture or pelvic bone diastasis. Osteopenia. SOFT TISSUES: Aortoiliac atherosclerosis. IMPRESSION: 1. Mildly impacted, superiorly displaced transcervical right femoral neck fracture with varus angulation. Electronically signed by: Rogelia Myers MD 05/07/2024 03:46 PM EST RP Workstation: [id number]     Procedures   Medications Ordered in the ED  fentaNYL (SUBLIMAZE) injection 50 mcg (50 mcg Intravenous Given 05/07/24 1603)                                    Medical Decision Making Amount and/or Complexity of Data Reviewed Labs: ordered. Radiology: ordered.  Risk Prescription drug management. Decision regarding hospitalization.   Patient with right hip pain.  Differential diagnose includes fractures and dislocations.  X-ray independently interpreted does show femoral neck fracture.  Discussed with Dr. Margrette who said potential OR tomorrow.  Blood work reassuring.  Patient ready for admission.  Will discuss with hospitalist.       Final diagnoses:  Closed fracture of right hip, initial encounter [facility])    ED Discharge Orders     None          Patsey Lot, MD 05/07/24 1658

## 2024-05-07 NOTE — Plan of Care (Signed)

## 2024-05-07 NOTE — H&P (Signed)
 History and Physical    Chelsea Ramos FMW:[id number] DOB: Dec 26, 1940 DOA: 05/07/2024  PCP: Tobie Suzzane POUR, MD   Patient coming from: Home  I have personally briefly reviewed patient's old medical records in [facility]  Chief Complaint: Fall  HPI: Chelsea Ramos is a 83 y.o. female with medical history significant for hypertension, COPD.  Patient presented to the ED with reports of a fall and subsequent right hip pain.  Patient lives alone, she ambulates without assistance or assistive devices, she reports some baseline gait problems over the past year.  She reports she was walking too fast to get to her phone that was ringing, lost her balance and fell.  She is unsure if she hit her head.  She reports dizziness when she tried to get up after she fell.  ED Course: Stable vitals.   Right hip x-ray shows mildly impacted displaced transcervical right femoral neck fracture.  Varus angulation. EDP talked to Dr. Margrette, possible surgery tomorrow, n.p.o. midnight.  Review of Systems: As per HPI all other systems reviewed and negative.  Past Medical History:  Diagnosis Date   Allergy    Arthritis    COPD (chronic obstructive pulmonary disease) (HCC)    Hernia of flank    Hypertension    Osteoporosis     Past Surgical History:  Procedure Laterality Date   COLONOSCOPY  1980's   COLONOSCOPY N/A 11/25/2015   Procedure: COLONOSCOPY;  Surgeon: Lamar CHRISTELLA Hollingshead, MD;  [facility];  Service: Endoscopy;  Laterality: N/A;  8:30 Am   DILATION AND CURETTAGE OF UTERUS     X 2   Left cataract extraction  1980's   Left foot surgery     1980's   Right cataract extraction       reports that she has been smoking cigarettes. She has a 20 pack-year smoking history. She has never used smokeless tobacco. She reports that she does not drink alcohol and does not use drugs.  Allergies  Allergen Reactions   Losartan Itching   Prednisone Itching    Family History  Problem Relation Age of  Onset   Prostate cancer Father    Diabetes Mother    Heart disease Mother    Heart attack Brother    Heart disease Brother    Alcohol abuse Brother    Heart disease Brother    Heart attack Brother    Arthritis Sister    Arthritis Sister    Prior to Admission medications   Medication Sig Start Date End Date Taking? Authorizing Provider  amLODipine (NORVASC) 5 MG tablet Take 1 tablet (5 mg total) by mouth daily. 04/03/24   Bevely Doffing, FNP  Ascorbic Acid (VITAMIN C PO) Take by mouth.    [provider]  aspirin 81 MG tablet Take 81 mg by mouth daily.    [provider]  Cholecalciferol (VITAMIN D-3 PO) Take by mouth.    [provider]  diphenhydrAMINE (EQ ALLERGY) 25 mg capsule Take 25 mg by mouth daily as needed for itching or allergies.    [provider]  furosemide (LASIX) 20 MG tablet Take 20 mg by mouth daily.    [provider]  LUTEIN PO Take by mouth.    [provider]  Multiple Vitamin (MULTIVITAMIN) capsule Take 2 capsules by mouth daily.    [provider]  risedronate  (ACTONEL ) 150 MG tablet TAKE ONE TABLET BY MOUTH EVERY 30 DAYS WITH WATER  ON EMPTY STOMACH, NOTHING  BY MOUTH OR LIE DOWN FOR NEXT 30 MINUTES Patient taking differently: TAKE ONE TABLET BY MOUTH WEEKLY WITH WATER  ON EMPTY STOMACH, NOTHING BY MOUTH OR LIE DOWN FOR NEXT 30 MINUTES 03/29/17   Jayne Vonn DEL, MD    Physical Exam: Vitals:   05/07/24 1442  BP: (!) 149/90  Pulse: (!) 103  Resp: 18  Temp: 97.6 F (36.4 C)  TempSrc: Oral  SpO2: 96%    Constitutional: NAD, calm, comfortable Vitals:   05/07/24 1442  BP: (!) 149/90  Pulse: (!) 103  Resp: 18  Temp: 97.6 F (36.4 C)  TempSrc: Oral  SpO2: 96%   Eyes: PERRL, lids and conjunctivae normal ENMT: Mucous membranes are moist.   Neck: normal, supple, no masses, no thyromegaly Respiratory: clear to auscultation bilaterally, no wheezing, no crackles. Normal respiratory effort. No  accessory muscle use.  Cardiovascular: Regular rate and rhythm, no murmurs / rubs / gallops. No extremity edema.  Extremities warm. Abdomen: no tenderness, no masses palpated. No hepatosplenomegaly. Bowel sounds positive.  Musculoskeletal: no clubbing / cyanosis. No joint deformity upper and lower extremities.  Skin: no rashes, lesions, ulcers. No induration Neurologic: No facial asymmetry, speech fluent, moves extremities spontaneously.SABRA  Psychiatric: Normal judgment and insight. Alert and oriented x 3. Normal mood.   Labs on Admission: I have personally reviewed following labs and imaging studies  CBC: Recent Labs  Lab 05/07/24 1538  WBC 4.8  NEUTROABS 3.4  HGB 12.6  HCT 39.4  MCV 94.0  PLT 171   Basic Metabolic Panel: Recent Labs  Lab 05/07/24 1538  NA 142  K 4.2  CL 106  CO2 23  GLUCOSE 101*  BUN 21  CREATININE 0.70  CALCIUM 9.3   Coagulation Profile: Recent Labs  Lab 05/07/24 1538  INR 0.9    Radiological Exams on Admission: DG Chest 1 View Result Date: 05/07/2024 EXAM: 1 VIEW(S) XRAY OF THE CHEST 05/07/2024 03:16:00 PM COMPARISON: 12/17/2017 CLINICAL HISTORY: hip pain after fall FINDINGS: LUNGS AND PLEURA: Emphysema. No focal pulmonary opacity. No pulmonary edema. No pleural effusion. No pneumothorax. HEART AND MEDIASTINUM: No acute abnormality of the cardiac and mediastinal silhouettes. BONES AND SOFT TISSUES: Minimal dextrocurvature of the Thoracic spine. IMPRESSION: 1. Emphysema. No acute cardiopulmonary abnormality. Electronically signed by: Rogelia Myers MD 05/07/2024 03:51 PM EST RP Workstation: [id number]   DG Hip Unilat With Pelvis 2-3 Views Right Result Date: 05/07/2024 EXAM: 2 OR MORE VIEW(S) XRAY OF THE RIGHT HIP 05/07/2024 03:16:00 PM COMPARISON: None available. CLINICAL HISTORY: hip pain after fall FINDINGS: BONES AND JOINTS: Mildly impacted, superiorly displaced transcervical right femoral neck fracture with varus angulation. No dislocation. No  associated pelvic bone fracture or pelvic bone diastasis. Osteopenia. SOFT TISSUES: Aortoiliac atherosclerosis. IMPRESSION: 1. Mildly impacted, superiorly displaced transcervical right femoral neck fracture with varus angulation. Electronically signed by: Rogelia Myers MD 05/07/2024 03:46 PM EST RP Workstation: [id number]   EKG: Independently reviewed.  Sinus rhythm, rate 95, QTc 429.  No significant abnormalities.  No prior EKG to compare.  Assessment/Plan Principal Problem:   Closed right hip fracture (HCC) Active Problems:   Hypertension   COPD (chronic obstructive pulmonary disease) (HCC)  Assessment and Plan: No notes have been filed under this hospital service. Service: Hospitalist  Close right hip fracture status post mechanical fall-baseline gait abnormalities, walking too fast and fell.  X-ray pelvis-  Mildly impacted, superiorly displaced transcervical right femoral neck fracture with varus angulation.  Chest x-ray EKG without acute abnormalities. - EDP talked to Dr. Margrette, will  see in consult in a.m., possible surgery tomorrow -N.p.o. midnight -Obtain head CT - LR 75 cc/h x 20 hours - IV morphine 2 mg every 4 hours as needed  COPD - Resume home regimen  Hypertension - Resume Norvasc 5 mg  DVT prophylaxis: SCDs Code Status: Full code. Family Communication: 1 of 2 sisters at bedside Disposition Plan: > 2 days Consults called: Orthopedics Admission status: Inpatient, MedSurg I certify that at the point of admission it is my clinical judgment that the patient will require inpatient hospital care spanning beyond 2 midnights from the point of admission due to high intensity of service, high risk for further deterioration and high frequency of surveillance required.  Author: Tully FORBES Carwin, MD 05/07/2024 6:40 PM  For on call review [email or web address].

## 2024-05-08 ENCOUNTER — Encounter (HOSPITAL_COMMUNITY)
Admission: EM | Disposition: A | Discharge status: Skilled Nursing Facility | Payer: Self-pay | Source: Home / Self Care | Attending: Internal Medicine

## 2024-05-08 ENCOUNTER — Inpatient Hospital Stay (HOSPITAL_COMMUNITY)

## 2024-05-08 ENCOUNTER — Encounter (HOSPITAL_COMMUNITY): Payer: Self-pay | Admitting: Internal Medicine

## 2024-05-08 ENCOUNTER — Inpatient Hospital Stay (HOSPITAL_COMMUNITY): Admitting: Anesthesiology

## 2024-05-08 DIAGNOSIS — F17210 Nicotine dependence, cigarettes, uncomplicated: Secondary | ICD-10-CM | POA: Diagnosis not present

## 2024-05-08 DIAGNOSIS — I10 Essential (primary) hypertension: Secondary | ICD-10-CM | POA: Diagnosis not present

## 2024-05-08 DIAGNOSIS — S72002A Fracture of unspecified part of neck of left femur, initial encounter for closed fracture: Secondary | ICD-10-CM | POA: Diagnosis not present

## 2024-05-08 DIAGNOSIS — S72001A Fracture of unspecified part of neck of right femur, initial encounter for closed fracture: Secondary | ICD-10-CM | POA: Diagnosis not present

## 2024-05-08 HISTORY — PX: HIP ARTHROPLASTY: SHX981

## 2024-05-08 LAB — BASIC METABOLIC PANEL WITH GFR
Anion gap: 9 (ref 5–15)
BUN: 13 mg/dL (ref 8–23)
CO2: 24 mmol/L (ref 22–32)
Calcium: 8.4 mg/dL — ABNORMAL LOW (ref 8.9–10.3)
Chloride: 104 mmol/L (ref 98–111)
Creatinine, Ser: 0.54 mg/dL (ref 0.44–1.00)
GFR, Estimated: >60 mL/min (ref >60)
Glucose, Bld: 99 mg/dL (ref 70–99)
Potassium: 3.9 mmol/L (ref 3.5–5.1)
Sodium: 137 mmol/L (ref 135–145)

## 2024-05-08 LAB — CBC
HCT: 37.5 % (ref 36.0–46.0)
Hemoglobin: 12 g/dL (ref 12.0–15.0)
MCH: 29.8 pg (ref 26.0–34.0)
MCHC: 32 g/dL (ref 30.0–36.0)
MCV: 93.1 fL (ref 80.0–100.0)
Platelets: 173 K/uL (ref 150–400)
RBC: 4.03 MIL/uL (ref 3.87–5.11)
RDW: 16.7 % — ABNORMAL HIGH (ref 11.5–15.5)
WBC: 5.6 K/uL (ref 4.0–10.5)
nRBC: 0 % (ref 0.0–0.2)

## 2024-05-08 LAB — SURGICAL PCR SCREEN
MRSA, PCR: NEGATIVE
Staphylococcus aureus: NEGATIVE

## 2024-05-08 SURGERY — HEMIARTHROPLASTY (BIPOLAR) HIP, POSTERIOR APPROACH FOR FRACTURE
Anesthesia: General | Site: Hip | Laterality: Right

## 2024-05-08 MED ORDER — CEFAZOLIN SODIUM-DEXTROSE 2-4 GM/100ML-% IV SOLN
2.0000 g | INTRAVENOUS | Status: AC
  Administered 2024-05-08: 2 g via INTRAVENOUS

## 2024-05-08 MED ORDER — FENTANYL CITRATE (PF) 50 MCG/ML IJ SOSY
25.0000 ug | PREFILLED_SYRINGE | INTRAMUSCULAR | Status: DC | PRN
  Administered 2024-05-08 (×2): 50 ug via INTRAVENOUS
  Filled 2024-05-08: qty 1

## 2024-05-08 MED ORDER — ONDANSETRON HCL 4 MG PO TABS: 4.0000 mg | ORAL_TABLET | Freq: Four times a day (QID) | ORAL | Status: DC | PRN

## 2024-05-08 MED ORDER — PHENYLEPHRINE 80 MCG/ML (10ML) SYRINGE FOR IV PUSH (FOR BLOOD PRESSURE SUPPORT)
PREFILLED_SYRINGE | INTRAVENOUS | Status: AC
  Filled 2024-05-08: qty 10

## 2024-05-08 MED ORDER — VECURONIUM BROMIDE 10 MG IV SOLR
INTRAVENOUS | Status: DC | PRN
  Administered 2024-05-08: 5 mg via INTRAVENOUS
  Administered 2024-05-08: 1 mg via INTRAVENOUS

## 2024-05-08 MED ORDER — TRANEXAMIC ACID-NACL 1000-0.7 MG/100ML-% IV SOLN
INTRAVENOUS | Status: AC
  Filled 2024-05-08: qty 100

## 2024-05-08 MED ORDER — METOCLOPRAMIDE HCL 5 MG/ML IJ SOLN: 5.0000 mg | Freq: Three times a day (TID) | INTRAMUSCULAR | Status: DC | PRN

## 2024-05-08 MED ORDER — CHLORHEXIDINE GLUCONATE 4 % EX SOLN: 60.0000 mL | Freq: Once | CUTANEOUS | Status: DC

## 2024-05-08 MED ORDER — ORAL CARE MOUTH RINSE: 15.0000 mL | Freq: Once | OROMUCOSAL | Status: AC

## 2024-05-08 MED ORDER — HYDROCODONE-ACETAMINOPHEN 5-325 MG PO TABS
1.0000 | ORAL_TABLET | ORAL | Status: AC | PRN
  Administered 2024-05-08: 2 via ORAL
  Filled 2024-05-08: qty 2

## 2024-05-08 MED ORDER — CHLORHEXIDINE GLUCONATE 0.12 % MT SOLN
15.0000 mL | Freq: Once | OROMUCOSAL | Status: AC
Start: 2024-05-08 — End: 2024-05-08
  Administered 2024-05-08: 15 mL via OROMUCOSAL

## 2024-05-08 MED ORDER — OXYCODONE HCL 5 MG PO TABS: 5.0000 mg | ORAL_TABLET | Freq: Once | ORAL | Status: DC | PRN

## 2024-05-08 MED ORDER — BUPIVACAINE-EPINEPHRINE (PF) 0.5% -1:200000 IJ SOLN
INTRAMUSCULAR | Status: AC
  Filled 2024-05-08: qty 30

## 2024-05-08 MED ORDER — FENTANYL CITRATE (PF) 50 MCG/ML IJ SOSY
PREFILLED_SYRINGE | INTRAMUSCULAR | Status: AC
  Filled 2024-05-08: qty 1

## 2024-05-08 MED ORDER — POVIDONE-IODINE 10 % EX SWAB: 2.0000 | Freq: Once | CUTANEOUS | Status: DC

## 2024-05-08 MED ORDER — ENOXAPARIN SODIUM 30 MG/0.3ML IJ SOSY
30.0000 mg | PREFILLED_SYRINGE | INTRAMUSCULAR | Status: DC
  Administered 2024-05-09 – 2024-05-10 (×2): 30 mg via SUBCUTANEOUS
  Filled 2024-05-08 (×2): qty 0.3

## 2024-05-08 MED ORDER — 0.9 % SODIUM CHLORIDE (POUR BTL) OPTIME
TOPICAL | Status: DC | PRN
  Administered 2024-05-08: 1000 mL

## 2024-05-08 MED ORDER — LACTATED RINGERS IV SOLN: INTRAVENOUS | Status: DC

## 2024-05-08 MED ORDER — FENTANYL CITRATE (PF) 100 MCG/2ML IJ SOLN
INTRAMUSCULAR | Status: AC
  Filled 2024-05-08: qty 2

## 2024-05-08 MED ORDER — OXYCODONE HCL 5 MG/5ML PO SOLN: 5.0000 mg | Freq: Once | ORAL | Status: DC | PRN

## 2024-05-08 MED ORDER — FENTANYL CITRATE (PF) 100 MCG/2ML IJ SOLN
INTRAMUSCULAR | Status: DC | PRN
  Administered 2024-05-08 (×2): 50 ug via INTRAVENOUS

## 2024-05-08 MED ORDER — PHENYLEPHRINE HCL-NACL 20-0.9 MG/250ML-% IV SOLN
INTRAVENOUS | Status: DC | PRN
  Administered 2024-05-08 (×2): 80 ug via INTRAVENOUS
  Administered 2024-05-08: 160 ug via INTRAVENOUS

## 2024-05-08 MED ORDER — SODIUM CHLORIDE 0.9 % IR SOLN
Status: DC | PRN
Start: 2024-05-08 — End: 2024-05-08
  Administered 2024-05-08: 3000 mL

## 2024-05-08 MED ORDER — METHOCARBAMOL 500 MG PO TABS
500.0000 mg | ORAL_TABLET | Freq: Four times a day (QID) | ORAL | Status: DC | PRN
  Administered 2024-05-09: 500 mg via ORAL
  Filled 2024-05-08: qty 1

## 2024-05-08 MED ORDER — METHOCARBAMOL 1000 MG/10ML IJ SOLN: 500.0000 mg | Freq: Four times a day (QID) | INTRAMUSCULAR | Status: DC | PRN

## 2024-05-08 MED ORDER — PROPOFOL 10 MG/ML IV BOLUS
INTRAVENOUS | Status: DC | PRN
  Administered 2024-05-08: 80 mg via INTRAVENOUS

## 2024-05-08 MED ORDER — BUPIVACAINE-EPINEPHRINE (PF) 0.5% -1:200000 IJ SOLN
INTRAMUSCULAR | Status: DC | PRN
  Administered 2024-05-08: 30 mL via PERINEURAL

## 2024-05-08 MED ORDER — PROPOFOL 10 MG/ML IV BOLUS
INTRAVENOUS | Status: AC
  Filled 2024-05-08: qty 20

## 2024-05-08 MED ORDER — DOCUSATE SODIUM 100 MG PO CAPS
100.0000 mg | ORAL_CAPSULE | Freq: Two times a day (BID) | ORAL | Status: DC
  Administered 2024-05-08 – 2024-05-10 (×4): 100 mg via ORAL
  Filled 2024-05-08 (×4): qty 1

## 2024-05-08 MED ORDER — LIDOCAINE HCL (CARDIAC) PF 100 MG/5ML IV SOSY
PREFILLED_SYRINGE | INTRAVENOUS | Status: DC | PRN
Start: 2024-05-08 — End: 2024-05-08
  Administered 2024-05-08: 50 mg via INTRATRACHEAL

## 2024-05-08 MED ORDER — PHENOL 1.4 % MT LIQD: 1.0000 | OROMUCOSAL | Status: DC | PRN

## 2024-05-08 MED ORDER — TRAMADOL HCL 50 MG PO TABS
50.0000 mg | ORAL_TABLET | Freq: Four times a day (QID) | ORAL | Status: DC
  Administered 2024-05-08 – 2024-05-10 (×8): 50 mg via ORAL
  Filled 2024-05-08 (×8): qty 1

## 2024-05-08 MED ORDER — TRANEXAMIC ACID-NACL 1000-0.7 MG/100ML-% IV SOLN
1000.0000 mg | INTRAVENOUS | Status: AC
  Administered 2024-05-08: 1000 mg via INTRAVENOUS

## 2024-05-08 MED ORDER — SUGAMMADEX SODIUM 200 MG/2ML IV SOLN
INTRAVENOUS | Status: DC | PRN
  Administered 2024-05-08: 100 mg via INTRAVENOUS

## 2024-05-08 MED ORDER — ONDANSETRON HCL 4 MG/2ML IJ SOLN: 4.0000 mg | Freq: Once | INTRAMUSCULAR | Status: DC | PRN

## 2024-05-08 MED ORDER — TRANEXAMIC ACID-NACL 1000-0.7 MG/100ML-% IV SOLN
1000.0000 mg | Freq: Once | INTRAVENOUS | Status: AC
  Administered 2024-05-08: 1000 mg via INTRAVENOUS
  Filled 2024-05-08: qty 100

## 2024-05-08 MED ORDER — DEXAMETHASONE SOD PHOSPHATE PF 10 MG/ML IJ SOLN
INTRAMUSCULAR | Status: DC | PRN
  Administered 2024-05-08: 4 mg via INTRAVENOUS

## 2024-05-08 MED ORDER — ONDANSETRON HCL 4 MG/2ML IJ SOLN: 4.0000 mg | Freq: Four times a day (QID) | INTRAMUSCULAR | Status: DC | PRN

## 2024-05-08 MED ORDER — CEFAZOLIN SODIUM-DEXTROSE 2-4 GM/100ML-% IV SOLN
2.0000 g | Freq: Four times a day (QID) | INTRAVENOUS | Status: AC
  Administered 2024-05-08 (×2): 2 g via INTRAVENOUS
  Filled 2024-05-08 (×2): qty 100

## 2024-05-08 MED ORDER — SENNOSIDES-DOCUSATE SODIUM 8.6-50 MG PO TABS: 1.0000 | ORAL_TABLET | Freq: Every evening | ORAL | Status: DC | PRN

## 2024-05-08 MED ORDER — MENTHOL 3 MG MT LOZG: 1.0000 | LOZENGE | OROMUCOSAL | Status: DC | PRN

## 2024-05-08 MED ORDER — ONDANSETRON HCL 4 MG/2ML IJ SOLN
INTRAMUSCULAR | Status: DC | PRN
  Administered 2024-05-08: 4 mg via INTRAVENOUS

## 2024-05-08 MED ORDER — METOCLOPRAMIDE HCL 10 MG PO TABS: 5.0000 mg | ORAL_TABLET | Freq: Three times a day (TID) | ORAL | Status: DC | PRN

## 2024-05-08 MED ORDER — LIDOCAINE 2% (20 MG/ML) 5 ML SYRINGE
INTRAMUSCULAR | Status: AC
  Filled 2024-05-08: qty 5

## 2024-05-08 MED ORDER — SODIUM CHLORIDE 0.9 % IV SOLN: INTRAVENOUS | Status: DC

## 2024-05-08 MED ORDER — CEFAZOLIN SODIUM-DEXTROSE 2-4 GM/100ML-% IV SOLN
INTRAVENOUS | Status: AC
  Filled 2024-05-08: qty 100

## 2024-05-08 MED ORDER — MORPHINE SULFATE (PF) 2 MG/ML IV SOLN: 2.0000 mg | INTRAVENOUS | Status: DC | PRN

## 2024-05-08 SURGICAL SUPPLY — 45 items
BALL HIP ARTICU 28 +5 (Hips) IMPLANT
BIT DRILL 2.8X128 (BIT) ×2 IMPLANT
BLADE SAGITTAL 25.0X1.27X90 (BLADE) ×2 IMPLANT
CHLORAPREP W/TINT 26 (MISCELLANEOUS) ×2 IMPLANT
CLOTH BEACON ORANGE TIMEOUT ST (SAFETY) ×2 IMPLANT
COUNTER NDL MAGNETIC 40 RED (SET/KITS/TRAYS/PACK) ×2 IMPLANT
COUNTER NEEDLE MAGNETIC 40 RED (SET/KITS/TRAYS/PACK) ×1 IMPLANT
COVER LIGHT HANDLE STERIS (MISCELLANEOUS) ×4 IMPLANT
DRAPE HIP W/POCKET STRL (MISCELLANEOUS) ×2 IMPLANT
DRAPE U-SHAPE 47X51 STRL (DRAPES) ×2 IMPLANT
DRESSING AQUACEL AG ADV 3.5X12 (MISCELLANEOUS) ×2 IMPLANT
DRSG MEPILEX SACRM 8.7X9.8 (GAUZE/BANDAGES/DRESSINGS) ×2 IMPLANT
ELECTRODE REM PT RTRN 9FT ADLT (ELECTROSURGICAL) ×2 IMPLANT
GLOVE BIOGEL PI IND STRL 7.0 (GLOVE) ×4 IMPLANT
GLOVE BIOGEL PI IND STRL 8.5 (GLOVE) ×2 IMPLANT
GLOVE SKINSENSE STRL SZ8.0 LF (GLOVE) ×4 IMPLANT
GOWN STRL REUS W/TWL LRG LVL3 (GOWN DISPOSABLE) ×4 IMPLANT
GOWN STRL REUS W/TWL XL LVL3 (GOWN DISPOSABLE) ×2 IMPLANT
HEAD BIPOLAR AML DEPUY 42 (Hips) IMPLANT
INST SET MAJOR BONE (KITS) ×2 IMPLANT
KIT TURNOVER KIT A (KITS) ×2 IMPLANT
MANIFOLD NEPTUNE II (INSTRUMENTS) ×2 IMPLANT
MARKER SKIN DUAL TIP RULER LAB (MISCELLANEOUS) ×2 IMPLANT
NDL HYPO 21X1.5 SAFETY (NEEDLE) ×2 IMPLANT
NDL MAYO 1/2 CRC TROCAR PT (NEEDLE) IMPLANT
NEEDLE HYPO 21X1.5 SAFETY (NEEDLE) ×1 IMPLANT
NEEDLE MAYO 1/2 CRC TROCAR PT (NEEDLE) ×1 IMPLANT
PACK TOTAL JOINT (CUSTOM PROCEDURE TRAY) ×2 IMPLANT
PAD ARMBOARD POSITIONER FOAM (MISCELLANEOUS) ×2 IMPLANT
PIN STMN SNGL STERILE 9X3.6MM (PIN) ×4 IMPLANT
SET BASIN LINEN APH (SET/KITS/TRAYS/PACK) ×2 IMPLANT
SET HNDPC FAN SPRY TIP SCT (DISPOSABLE) ×2 IMPLANT
SOLN 0.9% NACL POUR BTL 1000ML (IV SOLUTION) ×2 IMPLANT
SOLN STERILE WATER BTL 1000 ML (IV SOLUTION) ×4 IMPLANT
STAPLER VISISTAT (STAPLE) ×2 IMPLANT
STEM FEM ACTIS STD SZ7 (Nail) IMPLANT
SUT BRALON NAB BRD #1 30IN (SUTURE) ×4 IMPLANT
SUT ETHIBOND 5 LR DA (SUTURE) ×4 IMPLANT
SUT MNCRL 0 VIOLET CTX 36 (SUTURE) ×2 IMPLANT
SUT MON AB 0 CT1 (SUTURE) ×2 IMPLANT
SUT VIC AB 1 CT1 27XBRD ANTBC (SUTURE) ×8 IMPLANT
SYR 30ML LL (SYRINGE) ×2 IMPLANT
SYR BULB IRRIG 60ML STRL (SYRINGE) ×2 IMPLANT
TRAY FOLEY MTR SLVR 16FR STAT (SET/KITS/TRAYS/PACK) ×2 IMPLANT
YANKAUER SUCT 12FT TUBE ARGYLE (SUCTIONS) ×2 IMPLANT

## 2024-05-08 NOTE — Progress Notes (Signed)
 At 1518: RN messaged Dr Margrette, patient has morphine 2 mg IV Q 4 hr prn now, asked MD if would like RN to release the order for morphine 2 mg IV Q 2 hr prn. MD said yes

## 2024-05-08 NOTE — Plan of Care (Signed)

## 2024-05-08 NOTE — Anesthesia Preprocedure Evaluation (Signed)
 Anesthesia Evaluation  Patient identified by MRN, date of birth, ID band Patient awake    Reviewed: Allergy & Precautions, H&P , NPO status , Patient's Chart, lab work & pertinent test results, reviewed documented beta blocker date and time   Airway Mallampati: II  TM Distance: >3 FB Neck ROM: full    Dental no notable dental hx.    Pulmonary neg pulmonary ROS, COPD, Current Smoker   Pulmonary exam normal breath sounds clear to auscultation       Cardiovascular Exercise Tolerance: Good hypertension, negative cardio ROS  Rhythm:regular Rate:Normal     Neuro/Psych negative neurological ROS  negative psych ROS   GI/Hepatic negative GI ROS, Neg liver ROS,,,  Endo/Other  negative endocrine ROS    Renal/GU negative Renal ROS  negative genitourinary   Musculoskeletal   Abdominal   Peds  Hematology negative hematology ROS (+)   Anesthesia Other Findings   Reproductive/Obstetrics negative OB ROS                              Anesthesia Physical Anesthesia Plan  ASA: 3  Anesthesia Plan: General LMA and General   Post-op Pain Management:    Induction:   PONV Risk Score and Plan: Ondansetron   Airway Management Planned:   Additional Equipment:   Intra-op Plan:   Post-operative Plan:   Informed Consent: I have reviewed the patients History and Physical, chart, labs and discussed the procedure including the risks, benefits and alternatives for the proposed anesthesia with the patient or authorized representative who has indicated his/her understanding and acceptance.     Dental Advisory Given  Plan Discussed with: CRNA  Anesthesia Plan Comments:         Anesthesia Quick Evaluation

## 2024-05-08 NOTE — Transfer of Care (Signed)
 Immediate Anesthesia Transfer of Care Note  Patient: Chelsea Ramos  Procedure(s) Performed: HEMIARTHROPLASTY (BIPOLAR) HIP, lateral  APPROACH FOR FRACTURE (Right: Hip)  Patient Location: PACU  Anesthesia Type:General  Level of Consciousness: awake, alert , oriented, and patient cooperative  Airway & Oxygen Therapy: Patient Spontanous Breathing and Patient connected to nasal cannula oxygen  Post-op Assessment: Report given to RN, Post -op Vital signs reviewed and stable, and Patient moving all extremities X 4  Post vital signs: Reviewed and stable  Last Vitals:  Vitals Value Taken Time  BP 123/54 05/08/24 13:30  Temp 37.3 C 05/08/24 13:10  Pulse 101 05/08/24 13:31  Resp 15 05/08/24 13:31  SpO2 100 % 05/08/24 13:31  Vitals shown include unfiled device data.  Last Pain:  Vitals:   05/08/24 0810  TempSrc: Oral  PainSc:       Patients Stated Pain Goal: 3 (05/07/24 1447)  Complications: No notable events documented.

## 2024-05-08 NOTE — Progress Notes (Signed)
 At 0754 off floor to surgery

## 2024-05-08 NOTE — H&P (Signed)
 Chelsea Ramos is an 83 y.o. female.    Past Medical History:  Diagnosis Date   Allergy    Arthritis    COPD (chronic obstructive pulmonary disease) (HCC)    Hernia of flank    Hypertension    Osteoporosis     Past Surgical History:  Procedure Laterality Date   COLONOSCOPY  1980's   COLONOSCOPY N/A 11/25/2015   Procedure: COLONOSCOPY;  Surgeon: Lamar CHRISTELLA Hollingshead, MD;  Location: [facility];  Service: Endoscopy;  Laterality: N/A;  8:30 Am   DILATION AND CURETTAGE OF UTERUS     X 2   Left cataract extraction  1980's   Left foot surgery     1980's   Right cataract extraction      Family History  Problem Relation Age of Onset   Prostate cancer Father    Diabetes Mother    Heart disease Mother    Heart attack Brother    Heart disease Brother    Alcohol abuse Brother    Heart disease Brother    Heart attack Brother    Arthritis Sister    Arthritis Sister    Social History:  reports that she has been smoking cigarettes. She has a 20 pack-year smoking history. She has never used smokeless tobacco. She reports that she does not drink alcohol and does not use drugs.  Allergies:  Allergies  Allergen Reactions   Losartan Itching   Prednisone Itching    Medications Prior to Admission  Medication Sig Dispense Refill   amLODipine (NORVASC) 5 MG tablet Take 1 tablet (5 mg total) by mouth daily. 90 tablet 3   Ascorbic Acid (VITAMIN C PO) Take 1 tablet by mouth daily.     aspirin 81 MG tablet Take 81 mg by mouth every other day.     Cholecalciferol (VITAMIN D-3 PO) Take 1 tablet by mouth daily.     furosemide (LASIX) 20 MG tablet Take 20 mg by mouth 2 (two) times a week.     LORazepam (ATIVAN) 0.5 MG tablet Take 0.25 mg by mouth every 8 (eight) hours as needed for anxiety.     LUTEIN PO Take 1 capsule by mouth daily.     Multiple Vitamin (MULTIVITAMIN) capsule Take 2 capsules by mouth daily.     naproxen sodium (ALEVE) 220 MG tablet Take 220 mg by mouth daily as needed (pain).       Results for orders placed or performed during the hospital encounter of 05/07/24 (from the past 48 hours)  Basic metabolic panel     Status: Abnormal   Collection Time: 05/07/24  3:38 PM  Result Value Ref Range   Sodium 142 135 - 145 mmol/L   Potassium 4.2 3.5 - 5.1 mmol/L   Chloride 106 98 - 111 mmol/L   CO2 23 22 - 32 mmol/L   Glucose, Bld 101 (H) 70 - 99 mg/dL    Comment: Glucose reference range applies only to samples taken after fasting for at least 8 hours.   BUN 21 8 - 23 mg/dL   Creatinine, Ser 9.29 0.44 - 1.00 mg/dL   Calcium 9.3 8.9 - 89.6 mg/dL   GFR, Estimated >39 >39 mL/min    Comment: (NOTE) Calculated using the CKD-EPI Creatinine Equation (2021)    Anion gap 12 5 - 15    Comment: Performed at [facility], [address], [place], [place] [address]  CBC with Differential     Status: Abnormal   Collection Time: 05/07/24  3:38 PM  Result Value Ref Range   WBC 4.8 4.0 - 10.5 K/uL   RBC 4.19 3.87 - 5.11 MIL/uL   Hemoglobin 12.6 12.0 - 15.0 g/dL   HCT 60.5 63.9 - 53.9 %   MCV 94.0 80.0 - 100.0 fL   MCH 30.1 26.0 - 34.0 pg   MCHC 32.0 30.0 - 36.0 g/dL   RDW 82.9 (H) 88.4 - 84.4 %   Platelets 171 150 - 400 K/uL   nRBC 0.0 0.0 - 0.2 %   Neutrophils Relative % 72 %   Neutro Abs 3.4 1.7 - 7.7 K/uL   Lymphocytes Relative 18 %   Lymphs Abs 0.9 0.7 - 4.0 K/uL   Monocytes Relative 9 %   Monocytes Absolute 0.4 0.1 - 1.0 K/uL   Eosinophils Relative 0 %   Eosinophils Absolute 0.0 0.0 - 0.5 K/uL   Basophils Relative 0 %   Basophils Absolute 0.0 0.0 - 0.1 K/uL   Immature Granulocytes 1 %   Abs Immature Granulocytes 0.06 0.00 - 0.07 K/uL    Comment: Performed at [facility], [address], [place], [place] [address]  Protime-INR     Status: None   Collection Time: 05/07/24  3:38 PM  Result Value Ref Range   Prothrombin Time 13.0 11.4 - 15.2 seconds   INR 0.9 0.8 - 1.2    Comment: (NOTE) INR goal varies based on device and disease states. Performed at [facility], [address] [place], [place] [address]   Type and screen Ordered by PROVIDER DEFAULT     Status: None   Collection Time: 05/07/24  5:28 PM  Result Value Ref Range   ABO/RH(D) O POS    Antibody Screen NEG    Sample Expiration      05/10/2024,2359 Performed at [facility], [address], [place], [place] [address]   Basic metabolic panel     Status: Abnormal   Collection Time: 05/08/24  4:42 AM  Result Value Ref Range   Sodium 137 135 - 145 mmol/L   Potassium 3.9 3.5 - 5.1 mmol/L   Chloride 104 98 - 111 mmol/L   CO2 24 22 - 32 mmol/L   Glucose, Bld 99 70 - 99 mg/dL    Comment: Glucose reference range applies only to samples taken after fasting for at least 8 hours.   BUN 13 8 - 23 mg/dL   Creatinine, Ser 9.45 0.44 - 1.00 mg/dL   Calcium 8.4 (L) 8.9 - 10.3 mg/dL   GFR, Estimated >39 >39 mL/min    Comment: (NOTE) Calculated using the CKD-EPI Creatinine Equation (2021)    Anion gap 9 5 - 15    Comment: Performed at [facility], [address] [place], [place] [address]  CBC     Status: Abnormal   Collection Time: 05/08/24  4:42 AM  Result Value Ref Range   WBC 5.6 4.0 - 10.5 K/uL   RBC 4.03 3.87 - 5.11 MIL/uL   Hemoglobin 12.0 12.0 - 15.0 g/dL   HCT 62.4 63.9 - 53.9 %   MCV 93.1 80.0 - 100.0 fL   MCH 29.8 26.0 - 34.0 pg   MCHC 32.0 30.0 - 36.0 g/dL   RDW 83.2 (H) 88.4 - 84.4 %   Platelets 173 150 - 400 K/uL   nRBC 0.0 0.0 - 0.2 %    Comment: Performed at [facility], [address] [place], [place] [address]   CT HEAD WO CONTRAST ( ) Result Date: 05/07/2024 EXAM:  CT HEAD WITHOUT CONTRAST 05/07/2024 07:24:49 PM TECHNIQUE: CT of the head was performed without the administration of intravenous contrast. Automated exposure control, iterative reconstruction, and/or weight based adjustment of the mA/kV was utilized to reduce the radiation dose to as low as reasonably achievable. COMPARISON: None available. CLINICAL HISTORY: Fall, right hip fracture, dizziness after fall.  FINDINGS: BRAIN AND VENTRICLES: No acute hemorrhage. No evidence of acute infarct. No hydrocephalus. No extra-axial collection. No mass effect or midline shift. Patchy and confluent areas of decreased attenuation are noted throughout the deep and periventricular white matter of the cerebral hemispheres bilaterally, suggestive of chronic microvascular ischemic changes. Calcified atherosclerotic plaque within cavernous/supraclinoid internal carotid arteries. ORBITS: Bilateral lens replacements. SINUSES: No acute abnormality. SOFT TISSUES AND SKULL: No acute soft tissue abnormality. No skull fracture. IMPRESSION: 1. No acute intracranial abnormality. Electronically signed by: Morgane Naveau MD 05/07/2024 07:33 PM EST RP Workstation: [id number]   DG Chest 1 View Result Date: 05/07/2024 EXAM: 1 VIEW(S) XRAY OF THE CHEST 05/07/2024 03:16:00 PM COMPARISON: 12/17/2017 CLINICAL HISTORY: hip pain after fall FINDINGS: LUNGS AND PLEURA: Emphysema. No focal pulmonary opacity. No pulmonary edema. No pleural effusion. No pneumothorax. HEART AND MEDIASTINUM: No acute abnormality of the cardiac and mediastinal silhouettes. BONES AND SOFT TISSUES: Minimal dextrocurvature of the Thoracic spine. IMPRESSION: 1. Emphysema. No acute cardiopulmonary abnormality. Electronically signed by: Rogelia Myers MD 05/07/2024 03:51 PM EST RP Workstation: [id number]   DG Hip Unilat With Pelvis 2-3 Views Right Result Date: 05/07/2024 EXAM: 2 OR MORE VIEW(S) XRAY OF THE RIGHT HIP 05/07/2024 03:16:00 PM COMPARISON: None available. CLINICAL HISTORY: hip pain after fall FINDINGS: BONES AND JOINTS: Mildly impacted, superiorly displaced transcervical right femoral neck fracture with varus angulation. No dislocation. No associated pelvic bone fracture or pelvic bone diastasis. Osteopenia. SOFT TISSUES: Aortoiliac atherosclerosis. IMPRESSION: 1. Mildly impacted, superiorly displaced transcervical right femoral neck fracture with varus angulation.  Electronically signed by: Rogelia Myers MD 05/07/2024 03:46 PM EST RP Workstation: [id number]    Review of Systems  Blood pressure (!) 156/83, pulse (!) 104, temperature 98 F (36.7 C), temperature source Oral, resp. rate 16, height 5' (1.524 m), weight 43.1 kg, SpO2 91%. Physical Exam   Assessment/Plan   Taft Minerva, MD 05/08/2024, 7:16 AM

## 2024-05-08 NOTE — Op Note (Signed)
 Orthopaedic Surgery Operative Note (CSN: [id number])  Chelsea Ramos  1941/02/01 Date of Surgery: 05/08/2024 05/08/2024  1:04 PM  PATIENT:  Chelsea Ramos  83 y.o. female  PRE-OPERATIVE DIAGNOSIS:  left femoral neck fracture  POST-OPERATIVE DIAGNOSIS:  left femoral neck fracture  PROCEDURE:  Procedure(s): HEMIARTHROPLASTY (BIPOLAR) HIP, lateral  APPROACH FOR FRACTURE (Right)  SURGEON:  Surgeons and Role:    DEWAINE Margrette Taft FORBES, MD - Primary  PHYSICIAN ASSISTANT:   ASSISTANTS: CYNTHIA WRENN    ANESTHESIA:   general  EBL:  25 mL   BLOOD ADMINISTERED:none  DRAINS: none   LOCAL MEDICATIONS USED:  MARCAINE     SPECIMEN:  No Specimen  DISPOSITION OF SPECIMEN:  N/A  COUNTS:  YES  TOURNIQUET:  * No tourniquets in log *  DICTATION: .Dragon Dictation  PLAN OF CARE: Admit to inpatient   PATIENT DISPOSITION:  PACU - hemodynamically stable.   Delay start of Pharmacological VTE agent (>24hrs) due to surgical blood loss or risk of bleeding: not applicable    Implants: Implant Name Type Inv. Item Serial No. Manufacturer Lot No. LRB No. Used Action  STEM FEM ACTIS STD SZ7 - [id number] Nail STEM FEM ACTIS STD SZ7  [employer] [id number] Right 1 Implanted  HEAD BIPOLAR AML DEPUY 42 - [id number] Hips HEAD BIPOLAR AML DEPUY 42  [employer] [id number] Right 1 Implanted  BALL HIP ARTICU 28 +5 - [id number] Hips BALL HIP ARTICU 28 +5  [employer] [id number] Right 1 Implanted     Indication for surgery displaced right hip fracture  Transexamic  acid was given yes   PATIENT DISPOSITION:  PACU - hemodynamically stable.    Details of surgery: The patient was identified by 2 approved identification mechanisms. The operative extremity was evaluated and found to be acceptable for surgical treatment today. The chart was reviewed. The surgical site was confirmed initials were placed over the right hip  The patient was taken to the operating room and given Ancef 2 g  for antibiotics. This is consistent with the SCIP protocol.  The patient was given the following anesthetic: Normal  Foley catheter insertion was completed  The patient was then placed in the lateral decubitus position with appropriate padding. The surgical site was prepped and draped sterilely.  Timeout was executed confirming the patient's name, surgical site, antibiotic administration, x-rays available, and implants were checked and were available.  Incision was made over the greater trochanter extended proximally and distally approximately 3 cm  Subcutaneous tissue was divided down to fascia which was then split in line with the skin incision and deep retractors were placed.  The greater trochanteric bursa was resected exposing the abductors   The gluteus medius anterior half was subperiosteally dissected from the greater trochanter and tagged with #1 Vicryl sutures  The underlying gluteus minimus was split in continuity with the capsule and preserved tagging with Vicryl sutures as well   The femoral head was removed and measured 42  The acetabular was inspected: No arthritis  The hip was dislocated anteriorly into a sterile bag  Proximal femur was prepared starting with a femoral neck cutting guide, box osteotome, and further preparation per manufacture technique  Broaching was started with a size 1 and broached up to appropriate size based on proximal fit and fill.  This was a size 7 followed by planar  Trial reduction was then performed using a +5 neck length and 42 head size  Trial reduction we found the hip to be stable  in all positions with appropriate leg lengths  The trial implants were then removed 2 drill holes were placed in the greater trochanter and a #5 Ethibond was passed through the drill holes   The acetabulum was irrigated and cleaned of any bony debris, the  implants were placed and the hip was reduced  The hip was stable throughout the range of motion.     Hip flexion  Hip extension with external rotation  Sleep position stress test  Shuck test  Leg lengths  Local anesthetic Marcaine with epi was injected in the soft tissues including the vastus, abductors and skin, the abductors were repaired using the #5 Ethibond and #1 Vicryl  The hip was then abducted the fascia was closed with #1 Braylon  Subcutaneous tissues were closed with 0 Monocryl skin staples  A sterile dressing was applied  The patient was taken to the recovery room in stable condition   Postop plan  Weightbearing as tolerated Direct lateral hip precautions DVT prophylaxis for 30 days Remove staples at 12 to 14 days Postop appointment scheduled for 28 days  [id number]

## 2024-05-08 NOTE — Brief Op Note (Signed)
 05/08/2024  1:04 PM  PATIENT:  Chelsea Ramos  83 y.o. female  PRE-OPERATIVE DIAGNOSIS:  left femoral neck fracture  POST-OPERATIVE DIAGNOSIS:  left femoral neck fracture  PROCEDURE:  Procedure(s): HEMIARTHROPLASTY (BIPOLAR) HIP, lateral  APPROACH FOR FRACTURE (Right)  SURGEON:  Surgeons and Role:    * Margrette Taft BRAVO, MD - Primary  PHYSICIAN ASSISTANT:   ASSISTANTS: CYNTHIA WRENN    ANESTHESIA:   general  EBL:  25 mL   BLOOD ADMINISTERED:none  DRAINS: none   LOCAL MEDICATIONS USED:  MARCAINE     SPECIMEN:  No Specimen  DISPOSITION OF SPECIMEN:  N/A  COUNTS:  YES  TOURNIQUET:  * No tourniquets in log *  DICTATION: .Dragon Dictation  PLAN OF CARE: Admit to inpatient   PATIENT DISPOSITION:  PACU - hemodynamically stable.   Delay start of Pharmacological VTE agent (>24hrs) due to surgical blood loss or risk of bleeding: not applicable

## 2024-05-08 NOTE — Consult Note (Signed)
 Reason for Consult: Right hip fracture Referring Physician: Emergency room physician Dr. Patsey Jenkins Chelsea Ramos is an 83 y.o. female.  HPI: 83 year old female independent ambulator history of COPD osteoporosis hypertension fell fracture right hip.  Patient was brought in unable to ambulate complaining of right hip pain with notable deformity of external rotation and shortening of the right lower extremity  Pain was described as severe and increased with range of motion  Past Medical History:  Diagnosis Date   Allergy    Arthritis    COPD (chronic obstructive pulmonary disease) (HCC)    Hernia of flank    Hypertension    Osteoporosis     Past Surgical History:  Procedure Laterality Date   COLONOSCOPY  1980's   COLONOSCOPY N/A 11/25/2015   Procedure: COLONOSCOPY;  Surgeon: Lamar CHRISTELLA Hollingshead, MD;  Location: [facility];  Service: Endoscopy;  Laterality: N/A;  8:30 Am   DILATION AND CURETTAGE OF UTERUS     X 2   Left cataract extraction  1980's   Left foot surgery     1980's   Right cataract extraction      Family History  Problem Relation Age of Onset   Prostate cancer Father    Diabetes Mother    Heart disease Mother    Heart attack Brother    Heart disease Brother    Alcohol abuse Brother    Heart disease Brother    Heart attack Brother    Arthritis Sister    Arthritis Sister     Social History:  reports that she has been smoking cigarettes. She has a 20 pack-year smoking history. She has never used smokeless tobacco. She reports that she does not drink alcohol and does not use drugs.  Allergies:  Allergies  Allergen Reactions   Losartan Itching   Prednisone Itching    Medications: I have reviewed the patient's current medications.  Results for orders placed or performed during the hospital encounter of 05/07/24 (from the past 48 hours)  Basic metabolic panel     Status: Abnormal   Collection Time: 05/07/24  3:38 PM  Result Value Ref Range   Sodium 142 135 -  145 mmol/L   Potassium 4.2 3.5 - 5.1 mmol/L   Chloride 106 98 - 111 mmol/L   CO2 23 22 - 32 mmol/L   Glucose, Bld 101 (H) 70 - 99 mg/dL    Comment: Glucose reference range applies only to samples taken after fasting for at least 8 hours.   BUN 21 8 - 23 mg/dL   Creatinine, Ser 9.29 0.44 - 1.00 mg/dL   Calcium 9.3 8.9 - 89.6 mg/dL   GFR, Estimated >39 >39 mL/min    Comment: (NOTE) Calculated using the CKD-EPI Creatinine Equation (2021)    Anion gap 12 5 - 15    Comment: Performed at [facility], [address] [place], [place] [address]  CBC with Differential     Status: Abnormal   Collection Time: 05/07/24  3:38 PM  Result Value Ref Range   WBC 4.8 4.0 - 10.5 K/uL   RBC 4.19 3.87 - 5.11 MIL/uL   Hemoglobin 12.6 12.0 - 15.0 g/dL   HCT 60.5 63.9 - 53.9 %   MCV 94.0 80.0 - 100.0 fL   MCH 30.1 26.0 - 34.0 pg   MCHC 32.0 30.0 - 36.0 g/dL   RDW 82.9 (H) 88.4 - 84.4 %   Platelets 171 150 - 400 K/uL   nRBC 0.0 0.0 -  0.2 %   Neutrophils Relative % 72 %   Neutro Abs 3.4 1.7 - 7.7 K/uL   Lymphocytes Relative 18 %   Lymphs Abs 0.9 0.7 - 4.0 K/uL   Monocytes Relative 9 %   Monocytes Absolute 0.4 0.1 - 1.0 K/uL   Eosinophils Relative 0 %   Eosinophils Absolute 0.0 0.0 - 0.5 K/uL   Basophils Relative 0 %   Basophils Absolute 0.0 0.0 - 0.1 K/uL   Immature Granulocytes 1 %   Abs Immature Granulocytes 0.06 0.00 - 0.07 K/uL    Comment: Performed at [facility], [address], [place], [place] [address]  Protime-INR     Status: None   Collection Time: 05/07/24  3:38 PM  Result Value Ref Range   Prothrombin Time 13.0 11.4 - 15.2 seconds   INR 0.9 0.8 - 1.2    Comment: (NOTE) INR goal varies based on device and disease states. Performed at [facility], [address], [place], [place] [address]   Type and screen Ordered by PROVIDER DEFAULT     Status: None   Collection Time: 05/07/24  5:28 PM  Result Value Ref Range   ABO/RH(D) O POS    Antibody Screen NEG    Sample Expiration       05/10/2024,2359 Performed at [facility], [address], [place], [place] [address]   Basic metabolic panel     Status: Abnormal   Collection Time: 05/08/24  4:42 AM  Result Value Ref Range   Sodium 137 135 - 145 mmol/L   Potassium 3.9 3.5 - 5.1 mmol/L   Chloride 104 98 - 111 mmol/L   CO2 24 22 - 32 mmol/L   Glucose, Bld 99 70 - 99 mg/dL    Comment: Glucose reference range applies only to samples taken after fasting for at least 8 hours.   BUN 13 8 - 23 mg/dL   Creatinine, Ser 9.45 0.44 - 1.00 mg/dL   Calcium 8.4 (L) 8.9 - 10.3 mg/dL   GFR, Estimated >39 >39 mL/min    Comment: (NOTE) Calculated using the CKD-EPI Creatinine Equation (2021)    Anion gap 9 5 - 15    Comment: Performed at [facility], [address], [place], [place] [address]  CBC     Status: Abnormal   Collection Time: 05/08/24  4:42 AM  Result Value Ref Range   WBC 5.6 4.0 - 10.5 K/uL   RBC 4.03 3.87 - 5.11 MIL/uL   Hemoglobin 12.0 12.0 - 15.0 g/dL   HCT 62.4 63.9 - 53.9 %   MCV 93.1 80.0 - 100.0 fL   MCH 29.8 26.0 - 34.0 pg   MCHC 32.0 30.0 - 36.0 g/dL   RDW 83.2 (H) 88.4 - 84.4 %   Platelets 173 150 - 400 K/uL   nRBC 0.0 0.0 - 0.2 %    Comment: Performed at [facility], [address], [place], [place] [address]  Surgical pcr screen     Status: None   Collection Time: 05/08/24  5:30 AM  Result Value Ref Range   MRSA, PCR NEGATIVE NEGATIVE   Staphylococcus aureus NEGATIVE NEGATIVE    Comment: (NOTE) The Xpert SA Assay (FDA approved for NASAL specimens in patients 11 years of age and older), is one component of a comprehensive surveillance program. It is not intended to diagnose infection nor to guide or monitor treatment. Performed at [facility], [address] [place], [place] [address]     CT HEAD WO CONTRAST (  ) Result Date: 05/07/2024 EXAM: CT HEAD WITHOUT CONTRAST 05/07/2024 07:24:49 PM TECHNIQUE: CT of the head was performed without the administration of intravenous contrast. Automated  exposure control, iterative reconstruction, and/or weight based adjustment of the mA/kV was utilized to reduce the radiation dose to as low as reasonably achievable. COMPARISON: None available. CLINICAL HISTORY: Fall, right hip fracture, dizziness after fall. FINDINGS: BRAIN AND VENTRICLES: No acute hemorrhage. No evidence of acute infarct. No hydrocephalus. No extra-axial collection. No mass effect or midline shift. Patchy and confluent areas of decreased attenuation are noted throughout the deep and periventricular white matter of the cerebral hemispheres bilaterally, suggestive of chronic microvascular ischemic changes. Calcified atherosclerotic plaque within cavernous/supraclinoid internal carotid arteries. ORBITS: Bilateral lens replacements. SINUSES: No acute abnormality. SOFT TISSUES AND SKULL: No acute soft tissue abnormality. No skull fracture. IMPRESSION: 1. No acute intracranial abnormality. Electronically signed by: Morgane Naveau MD 05/07/2024 07:33 PM EST RP Workstation: [id number]   DG Chest 1 View Result Date: 05/07/2024 EXAM: 1 VIEW(S) XRAY OF THE CHEST 05/07/2024 03:16:00 PM COMPARISON: 12/17/2017 CLINICAL HISTORY: hip pain after fall FINDINGS: LUNGS AND PLEURA: Emphysema. No focal pulmonary opacity. No pulmonary edema. No pleural effusion. No pneumothorax. HEART AND MEDIASTINUM: No acute abnormality of the cardiac and mediastinal silhouettes. BONES AND SOFT TISSUES: Minimal dextrocurvature of the Thoracic spine. IMPRESSION: 1. Emphysema. No acute cardiopulmonary abnormality. Electronically signed by: Rogelia Myers MD 05/07/2024 03:51 PM EST RP Workstation: [id number]   DG Hip Unilat With Pelvis 2-3 Views Right Result Date: 05/07/2024 EXAM: 2 OR MORE VIEW(S) XRAY OF THE RIGHT HIP 05/07/2024 03:16:00 PM COMPARISON: None available. CLINICAL HISTORY: hip pain after fall FINDINGS: BONES AND JOINTS: Mildly impacted, superiorly displaced transcervical right femoral neck fracture with varus  angulation. No dislocation. No associated pelvic bone fracture or pelvic bone diastasis. Osteopenia. SOFT TISSUES: Aortoiliac atherosclerosis. IMPRESSION: 1. Mildly impacted, superiorly displaced transcervical right femoral neck fracture with varus angulation. Electronically signed by: Rogelia Myers MD 05/07/2024 03:46 PM EST RP Workstation: [id number]    Review of Systems patient is independent ambulator no recent illnesses  She has a left inguinal hernia which is currently asymptomatic Arthritis possible neuropathy right foot followed by podiatry  Otherwise normal  Blood pressure (!) 156/83, pulse 100, temperature 98.5 F (36.9 C), temperature source Oral, resp. rate 18, height 5' (1.524 m), weight 43.1 kg, SpO2 94%. Physical Exam Vitals and nursing note reviewed.  Constitutional:      General: She is not in acute distress.    Appearance: Normal appearance. She is normal weight. She is not ill-appearing, toxic-appearing or diaphoretic.  HENT:     Head: Normocephalic and atraumatic.     Right Ear: External ear normal.     Left Ear: External ear normal.     Nose: Nose normal. No congestion or rhinorrhea.     Mouth/Throat:     Pharynx: Oropharynx is clear. No oropharyngeal exudate.  Eyes:     General: No scleral icterus.       Right eye: No discharge.        Left eye: No discharge.     Extraocular Movements: Extraocular movements intact.     Conjunctiva/sclera: Conjunctivae normal.     Pupils: Pupils are equal, round, and reactive to light.  Cardiovascular:     Rate and Rhythm: Normal rate.     Pulses: Normal pulses.  Pulmonary:     Effort: Pulmonary effort is normal.     Breath sounds: No stridor. No wheezing or rhonchi.  Abdominal:  General: Abdomen is flat. There is no distension.     Hernia: A hernia is present.  Musculoskeletal:     Cervical back: Neck supple.     Comments: External rotation deformity right hip with shortening. Skin is intact Tenderness noted over  the fracture at the proximal femur and hip Hip no instability noted on x-ray Motor exam normal  Right and left upper extremity skin is intact there is no tenderness range of motion is normal no instability motor exam normal  Left lower extremity skin normal no tenderness normal range of motion no contractures no instability normal motor exam  Skin:    General: Skin is warm and dry.     Capillary Refill: Capillary refill takes less than 2 seconds.  Neurological:     General: No focal deficit present.     Mental Status: She is alert and oriented to person, place, and time. Mental status is at baseline.     Cranial Nerves: No cranial nerve deficit.     Sensory: No sensory deficit.     Motor: No weakness.     Coordination: Coordination normal.     Gait: Gait abnormal.     Deep Tendon Reflexes: Reflexes normal.  Psychiatric:        Mood and Affect: Mood normal.        Behavior: Behavior normal.        Thought Content: Thought content normal.        Judgment: Judgment normal.     Assessment/Plan: Images were reviewed including the pelvis and hip patient has a femoral neck fracture complete 100% displacement no arthritis in the joint  The patient is independent ambulator but over the age of 89  Recommendations for right bipolar hip arthroplasty with less risk of IntraOp fracture and less risk of dislocation then total hip  The procedure has been fully reviewed with the patient; The risks and benefits of surgery have been discussed and explained and understood. Alternative treatment has also been reviewed, questions were encouraged and answered. The postoperative plan is also been reviewed.  Chelsea Ramos 05/08/2024, 11:22 AM

## 2024-05-08 NOTE — Progress Notes (Signed)
 PROGRESS NOTE    Chelsea Ramos  FMW:[id number] DOB: January 29, 1941 DOA: 05/07/2024 PCP: Bevely Doffing, FNP   Brief Narrative:   83 y.o. female with medical history significant for hypertension, COPD.  Patient presented to the ED with reports of a fall and subsequent right hip pain.  Patient lives alone, she ambulates without assistance or assistive devices, she reports some baseline gait problems over the past year.Right hip x-ray shows mildly impacted displaced transcervical right femoral neck fracture. Varus angulation.  Ortho was consulted and plan is for surgical intervention today. She will need PT/OT evaluation after the surgery and possible rehab placement versus home health services, depending on how she does after the surgery.  Assessment & Plan:  Principal Problem:   Closed right hip fracture (HCC) Active Problems:   Hypertension   COPD (chronic obstructive pulmonary disease) (HCC)    Close right hip fracture status post mechanical fall- X-ray pelvis-  Mildly impacted, superiorly displaced transcervical right femoral neck fracture with varus angulation.   Chest x-ray EKG without acute abnormalities. - Orthopedics on board - Patient has bee n.p.o.past midnight - Received IVF - IV morphine 2 mg every 4 hours as needed - Plan for surgical intervention today - She will need PT/OT evaluation after the surgical intervention  Mechanical fall, POA: I reviewed the CT of the head with did not show any acute intracranial abnormalities.  PT/OT evaluations after the right hip surgery.   COPD, POA: - As needed inhalational bronchodilator therapy   Hypertension - Resumed Norvasc 5 mg  Disposition: She lives at home.  She will need PT/OT evaluation after the right hip surgery and may need home health services versus skilled nursing facility placement, depending on how she does.  DVT prophylaxis: SCDs Start: 05/07/24 1941     Code Status: Full Code Family Communication: None at the  bedside Status is: Inpatient Remains inpatient appropriate because: right hip Fracture, needs surgery    Subjective:  Patient was admitted yesterday evening with right hip fracture.  Orthopedics was consulted and she is going for right hip surgery this morning.  She has been kept n.p.o. after midnight.  Examination:  General exam: Appears calm and comfortable  Respiratory system: Clear to auscultation. Respiratory effort normal. Cardiovascular system: S1 & S2 heard, RRR. No JVD, murmurs, rubs, gallops or clicks. No pedal edema. Gastrointestinal system: Abdomen is nondistended, soft and nontender. No organomegaly or masses felt. Normal bowel sounds heard. Central nervous system: Alert and oriented. No focal neurological deficits. Extremities: Right lower extremity movements are limited secondary to fracture Skin: No rashes, lesions or ulcers Psychiatry: Judgement and insight appear normal. Mood & affect appropriate.       Diet Orders (From admission, onward)     Start     Ordered   05/08/24 0430  Diet NPO time specified  Diet effective ____        05/07/24 2055            Objective: Vitals:   05/07/24 2353 05/08/24 0341 05/08/24 0810 05/08/24 0818  BP: (!) 144/82 (!) 156/83    Pulse: 95 (!) 104  100  Resp: 18 16 18    Temp: 98.3 F (36.8 C) 98 F (36.7 C) 98.5 F (36.9 C)   TempSrc: Oral Oral Oral   SpO2: 92% 91%  94%  Weight:      Height:        Intake/Output Summary (Last 24 hours) at 05/08/2024 1041 Last data filed at 05/08/2024 0500 Gross per  24 hour  Intake --  Output 600 ml  Net -600 ml   Filed Weights   05/07/24 1952  Weight: 43.1 kg    Scheduled Meds:  [MAR Hold] amLODipine  5 mg Oral Daily   chlorhexidine  60 mL Topical Once   povidone-iodine  2 Application Topical Once   Continuous Infusions:  ceFAZolin      ceFAZolin (ANCEF) IV     lactated ringers 75 mL/hr at 05/07/24 2149   lactated ringers 10 mL/hr at 05/08/24 0818   tranexamic  acid     tranexamic acid      Nutritional status     Body mass index is 18.56 kg/m.  Data Reviewed:   CBC: Recent Labs  Lab 05/07/24 1538 05/08/24 0442  WBC 4.8 5.6  NEUTROABS 3.4  --   HGB 12.6 12.0  HCT 39.4 37.5  MCV 94.0 93.1  PLT 171 173   Basic Metabolic Panel: Recent Labs  Lab 05/07/24 1538 05/08/24 0442  NA 142 137  K 4.2 3.9  CL 106 104  CO2 23 24  GLUCOSE 101* 99  BUN 21 13  CREATININE 0.70 0.54  CALCIUM 9.3 8.4*   GFR: Estimated Creatinine Clearance: 37.5 mL/min (by C-G formula based on SCr of 0.54 mg/dL). Liver Function Tests: No results for input(s): AST, ALT, ALKPHOS, BILITOT, PROT, ALBUMIN in the last 168 hours. No results for input(s): LIPASE, AMYLASE in the last 168 hours. No results for input(s): AMMONIA in the last 168 hours. Coagulation Profile: Recent Labs  Lab 05/07/24 1538  INR 0.9   Cardiac Enzymes: No results for input(s): CKTOTAL, CKMB, CKMBINDEX, TROPONINI in the last 168 hours. BNP (last 3 results) No results for input(s): PROBNP in the last 8760 hours. HbA1C: No results for input(s): HGBA1C in the last 72 hours. CBG: No results for input(s): GLUCAP in the last 168 hours. Lipid Profile: No results for input(s): CHOL, HDL, LDLCALC, TRIG, CHOLHDL, LDLDIRECT in the last 72 hours. Thyroid  Function Tests: No results for input(s): TSH, T4TOTAL, FREET4, T3FREE, THYROIDAB in the last 72 hours. Anemia Panel: No results for input(s): VITAMINB12, FOLATE, FERRITIN, TIBC, IRON, RETICCTPCT in the last 72 hours. Sepsis Labs: No results for input(s): PROCALCITON, LATICACIDVEN in the last 168 hours.  Recent Results (from the past 240 hours)  Surgical pcr screen     Status: None   Collection Time: 05/08/24  5:30 AM  Result Value Ref Range Status   MRSA, PCR NEGATIVE NEGATIVE Final   Staphylococcus aureus NEGATIVE NEGATIVE Final    Comment: (NOTE) The Xpert SA Assay  (FDA approved for NASAL specimens in patients 1 years of age and older), is one component of a comprehensive surveillance program. It is not intended to diagnose infection nor to guide or monitor treatment. Performed at [facility], [address], [place], [place] [address]          Radiology Studies: CT HEAD WO CONTRAST ( ) Result Date: 05/07/2024 EXAM: CT HEAD WITHOUT CONTRAST 05/07/2024 07:24:49 PM TECHNIQUE: CT of the head was performed without the administration of intravenous contrast. Automated exposure control, iterative reconstruction, and/or weight based adjustment of the mA/kV was utilized to reduce the radiation dose to as low as reasonably achievable. COMPARISON: None available. CLINICAL HISTORY: Fall, right hip fracture, dizziness after fall. FINDINGS: BRAIN AND VENTRICLES: No acute hemorrhage. No evidence of acute infarct. No hydrocephalus. No extra-axial collection. No mass effect or midline shift. Patchy and confluent areas of decreased attenuation are noted throughout the deep and  periventricular white matter of the cerebral hemispheres bilaterally, suggestive of chronic microvascular ischemic changes. Calcified atherosclerotic plaque within cavernous/supraclinoid internal carotid arteries. ORBITS: Bilateral lens replacements. SINUSES: No acute abnormality. SOFT TISSUES AND SKULL: No acute soft tissue abnormality. No skull fracture. IMPRESSION: 1. No acute intracranial abnormality. Electronically signed by: Morgane Naveau MD 05/07/2024 07:33 PM EST RP Workstation: [id number]   DG Chest 1 View Result Date: 05/07/2024 EXAM: 1 VIEW(S) XRAY OF THE CHEST 05/07/2024 03:16:00 PM COMPARISON: 12/17/2017 CLINICAL HISTORY: hip pain after fall FINDINGS: LUNGS AND PLEURA: Emphysema. No focal pulmonary opacity. No pulmonary edema. No pleural effusion. No pneumothorax. HEART AND MEDIASTINUM: No acute abnormality of the cardiac and mediastinal silhouettes. BONES AND SOFT TISSUES: Minimal  dextrocurvature of the Thoracic spine. IMPRESSION: 1. Emphysema. No acute cardiopulmonary abnormality. Electronically signed by: Rogelia Myers MD 05/07/2024 03:51 PM EST RP Workstation: [id number]   DG Hip Unilat With Pelvis 2-3 Views Right Result Date: 05/07/2024 EXAM: 2 OR MORE VIEW(S) XRAY OF THE RIGHT HIP 05/07/2024 03:16:00 PM COMPARISON: None available. CLINICAL HISTORY: hip pain after fall FINDINGS: BONES AND JOINTS: Mildly impacted, superiorly displaced transcervical right femoral neck fracture with varus angulation. No dislocation. No associated pelvic bone fracture or pelvic bone diastasis. Osteopenia. SOFT TISSUES: Aortoiliac atherosclerosis. IMPRESSION: 1. Mildly impacted, superiorly displaced transcervical right femoral neck fracture with varus angulation. Electronically signed by: Rogelia Myers MD 05/07/2024 03:46 PM EST RP Workstation: [id number]           LOS: 1 day   Time spent= 35 mins    Deliliah Room, MD [facility]  If 7PM-7AM, please contact night-coverage  05/08/2024, 10:41 AM

## 2024-05-08 NOTE — Anesthesia Procedure Notes (Signed)
 Procedure Name: Intubation Date/Time: 05/08/2024 11:42 AM  Performed by: Pheobe Adine CROME, CRNAPre-anesthesia Checklist: Patient identified, Emergency Drugs available, Suction available, Patient being monitored and Timeout performed Patient Re-evaluated:Patient Re-evaluated prior to induction Oxygen Delivery Method: Circle system utilized Preoxygenation: Pre-oxygenation with 100% oxygen Induction Type: IV induction Laryngoscope Size: Miller and 2 Grade View: Grade II Tube type: Oral Tube size: 7.0 mm Number of attempts: 1 Airway Equipment and Method: Stylet Placement Confirmation: ETT inserted through vocal cords under direct vision, positive ETCO2, CO2 detector and breath sounds checked- equal and bilateral Secured at: 21 cm Tube secured with: Tape Dental Injury: Teeth and Oropharynx as per pre-operative assessment

## 2024-05-08 NOTE — TOC CM/SW Note (Signed)
 Transition of Care [facility]) - Inpatient Brief Assessment   Patient Details  Name: Chelsea Ramos MRN: [id number] Date of Birth: 1940-07-24  Transition of Care [facility]) CM/SW Contact:    Lucie Lunger, LCSWA Phone Number: 05/08/2024, 9:28 AM   Clinical Narrative: Transition of Care Department [facility]) has reviewed patient and no TOC needs have been identified at this time. We will continue to monitor patient advancement through interdiciplinary progression rounds. If new patient transition needs arise, please place a TOC consult.  Transition of Care Asessment: Insurance and Status: Insurance coverage has been reviewed Patient has primary care physician: Yes Home environment has been reviewed: From home Prior level of function:: Independent Prior/Current Home Services: No current home services Social Drivers of Health Review: SDOH reviewed no interventions necessary Readmission risk has been reviewed: Yes Transition of care needs: no transition of care needs at this time

## 2024-05-09 ENCOUNTER — Encounter (HOSPITAL_COMMUNITY): Payer: Self-pay | Admitting: Orthopedic Surgery

## 2024-05-09 DIAGNOSIS — S72001A Fracture of unspecified part of neck of right femur, initial encounter for closed fracture: Secondary | ICD-10-CM | POA: Diagnosis not present

## 2024-05-09 LAB — CBC
HCT: 33.3 % — ABNORMAL LOW (ref 36.0–46.0)
Hemoglobin: 10.8 g/dL — ABNORMAL LOW (ref 12.0–15.0)
MCH: 31 pg (ref 26.0–34.0)
MCHC: 32.4 g/dL (ref 30.0–36.0)
MCV: 95.7 fL (ref 80.0–100.0)
Platelets: 143 K/uL — ABNORMAL LOW (ref 150–400)
RBC: 3.48 MIL/uL — ABNORMAL LOW (ref 3.87–5.11)
RDW: 17.2 % — ABNORMAL HIGH (ref 11.5–15.5)
WBC: 9.9 K/uL (ref 4.0–10.5)
nRBC: 0 % (ref 0.0–0.2)

## 2024-05-09 LAB — BASIC METABOLIC PANEL WITH GFR
Anion gap: 9 (ref 5–15)
BUN: 21 mg/dL (ref 8–23)
CO2: 24 mmol/L (ref 22–32)
Calcium: 8 mg/dL — ABNORMAL LOW (ref 8.9–10.3)
Chloride: 103 mmol/L (ref 98–111)
Creatinine, Ser: 0.88 mg/dL (ref 0.44–1.00)
GFR, Estimated: >60 mL/min (ref >60)
Glucose, Bld: 115 mg/dL — ABNORMAL HIGH (ref 70–99)
Potassium: 4.2 mmol/L (ref 3.5–5.1)
Sodium: 136 mmol/L (ref 135–145)

## 2024-05-09 NOTE — Plan of Care (Signed)

## 2024-05-09 NOTE — TOC Initial Note (Addendum)
 Transition of Care [facility]) - Initial/Assessment Note    Patient Details  Name: Chelsea Ramos MRN: [id number] Date of Birth: 1941-06-10  Transition of Care [facility]) CM/SW Contact:    Noreen KATHEE Cleotilde ISRAEL Phone Number: 05/09/2024, 3:19 PM  Clinical Narrative:                  CSW spoke with patient and her daughter at bedside. Patient lives alone and has two old canes. Patient was fairly independent at home with everything. CSW spoke with patient about PT recommendation for SNF. Patient and daughter agreeable and wanted referral to go to [facility].   Daughter Chelsea Ramos  who lives in [place]  [place] asked to be contacted as well regarding updates. [phone].   PASRR #[id number]   Expected Discharge Plan: Skilled Nursing Facility Barriers to Discharge: Continued Medical Work up   Patient Goals and CMS Choice Patient states their goals for this hospitalization and ongoing recovery are:: Get stronger CMS Medicare.gov Compare Post Acute Care list provided to:: Patient Choice offered to / list presented to : Patient, Adult Children  ownership interest in [facility].provided to:: Patient    Expected Discharge Plan and Services   Discharge Planning Services: CM Consult   Living arrangements for the past 2 months: Single Family Home                                      Prior Living Arrangements/Services Living arrangements for the past 2 months: Single Family Home Lives with:: Self Patient language and need for interpreter reviewed:: Yes Do you feel safe going back to the place where you live?: No   Patient needs short-term rehab  Need for Family Participation in Patient Care: No (Comment) Care giver support system in place?: No (comment)   Criminal Activity/Legal Involvement Pertinent to Current Situation/Hospitalization: No - Comment as needed  Activities of Daily Living   ADL Screening (condition at time of admission) Independently performs ADLs?: Yes  (appropriate for developmental age) Is the patient deaf or have difficulty hearing?: No Does the patient have difficulty seeing, even when wearing glasses/contacts?: No Does the patient have difficulty concentrating, remembering, or making decisions?: No  Permission Sought/Granted      Share Information with NAME: Iara and Chelsea Ramos     Permission granted to share info w Relationship: Patient and daughter     Emotional Assessment Appearance:: Appears stated age Attitude/Demeanor/Rapport: Engaged, Self-Confident Affect (typically observed): Accepting Orientation: : Oriented to Self, Oriented to Place, Oriented to  Time, Oriented to Situation Alcohol / Substance Use: Tobacco Use Psych Involvement: No (comment)  Admission diagnosis:  Closed right hip fracture (HCC) [S72.001A] Closed fracture of right hip, initial encounter (HCC) [S72.001A] Patient Active Problem List   Diagnosis Date Noted   Closed fracture of neck of right femur (HCC) 05/07/2024   COPD (chronic obstructive pulmonary disease) (HCC) 05/07/2024   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/2024   S/P tympanic tube insertion 04/06/2024   Ventral hernia without obstruction or gangrene 02/02/2023   RLQ abdominal pain 02/02/2023   Vaginal discharge 02/02/2023   Diverticulosis of colon without hemorrhage    Hypertension 02/06/2013   Arthritis 02/06/2013   PCP:  Bevely Doffing, FNP Pharmacy:   [facility] - [place],  - [address] [place] [address] Phone: [phone] Fax: [phone]     Social Drivers of Health (SDOH) Social History: SDOH Screenings  Food Insecurity: No Food Insecurity (02/02/2023)  Housing: Medium Risk (02/02/2023)  Transportation Needs: No Transportation Needs (02/02/2023)  Utilities: Not At Risk (02/02/2023)  Alcohol Screen: Low Risk  (02/02/2023)  Depression (PHQ2-9): Low Risk  (04/03/2024)  Financial Resource Strain: Low Risk  (02/02/2023)  Physical Activity: Insufficiently Active  (02/02/2023)  Social Connections: Moderately Isolated (02/02/2023)  Stress: No Stress Concern Present (02/02/2023)  Tobacco Use: High Risk (05/08/2024)   SDOH Interventions:     Readmission Risk Interventions    05/09/2024    3:18 PM  Readmission Risk Prevention Plan  Medication Screening Complete  Transportation Screening Complete

## 2024-05-09 NOTE — Progress Notes (Signed)
 PROGRESS NOTE    Chelsea Ramos  FMW:[id number] DOB: Jan 12, 1941 DOA: 05/07/2024 PCP: Bevely Doffing, FNP   Brief Narrative:   83 y.o. female with medical history significant for hypertension, COPD.  Patient presented to the ED with reports of a fall and subsequent right hip pain.  Patient lives alone, she ambulates without assistance or assistive devices, she reports some baseline gait problems over the past year.Right hip x-ray shows mildly impacted displaced transcervical right femoral neck fracture. Varus angulation.  Ortho was consulted and she underwent surgical intervention on 05/08/2024.  PT OT on board.  She will likely need skilled nursing facility placement on discharge.  Assessment & Plan:  Principal Problem:   Closed fracture of neck of right femur (HCC) Active Problems:   Hypertension   COPD (chronic obstructive pulmonary disease) (HCC)    Close right hip fracture status post mechanical fall-status post right hip bipolar hemiarthroplasty, done on 05/08/2024, postop day 1 X-ray pelvis-  Mildly impacted, superiorly displaced transcervical right femoral neck fracture with varus angulation.   Chest x-ray EKG without acute abnormalities. - Orthopedics on board, outpatient follow-up with them on the scheduled appointment. - Continue with as needed analgesics - Continue with PT/OT and possible skilled nursing facility placement on discharge.  Mechanical fall, POA: I reviewed the CT of the head with did not show any acute intracranial abnormalities.  PT/OT evaluations after the right hip surgery.   COPD, POA: - As needed inhalational bronchodilator therapy   Hypertension - Resumed Norvasc 5 mg  Disposition: She lives at home by herself.  She would likely need skilled nursing facility placement on discharge.  Discussed with case management.  DVT prophylaxis: enoxaparin (LOVENOX) injection 30 mg Start: 05/09/24 1000 SCDs Start: 05/08/24 1514 SCDs Start: 05/07/24 1941     Code  Status: Full Code Family Communication: None at the bedside Status is: Inpatient Remains inpatient appropriate because: right hip Fracture, needs surgery    Subjective:  No acute overnight.  She is complaining of mild postoperative right hip pain.  Said that she was living at home by herself prior to this admission.  She said that her daughter is coming from [place]  and she will discuss with her regarding possibly going to a skilled nursing facility on discharge.  I spoke to her that I will let the case management know.  Examination:  General exam: Appears calm and comfortable  Respiratory system: Clear to auscultation. Respiratory effort normal. Cardiovascular system: S1 & S2 heard, RRR. No JVD, murmurs, rubs, gallops or clicks. No pedal edema. Gastrointestinal system: Abdomen is nondistended, soft and nontender. No organomegaly or masses felt. Normal bowel sounds heard. Central nervous system: Alert and oriented. No focal neurological deficits. Extremities: Right hip dressing in place, no discharge Skin: No rashes, lesions or ulcers Psychiatry: Judgement and insight appear normal. Mood & affect appropriate.       Diet Orders (From admission, onward)     Start     Ordered   05/08/24 1514  Diet regular Room service appropriate? Yes; Fluid consistency: Thin  Diet effective now       Question Answer Comment  Room service appropriate? Yes   Fluid consistency: Thin      05/08/24 1513            Objective: Vitals:   05/08/24 2051 05/09/24 0537 05/09/24 0929 05/09/24 0935  BP: 108/60 115/60 (!) 102/51   Pulse: (!) 108 (!) 105 99   Resp: 16 17 18    Temp: 98  F (36.7 C) 99.1 F (37.3 C)  98.5 F (36.9 C)  TempSrc: Oral Oral  Oral  SpO2: 92% 91% (!) 87% 94%  Weight:      Height:        Intake/Output Summary (Last 24 hours) at 05/09/2024 1111 Last data filed at 05/09/2024 0537 Gross per 24 hour  Intake 700 ml  Output 775 ml  Net -75 ml   Filed Weights    05/07/24 1952  Weight: 43.1 kg    Scheduled Meds:  amLODipine  5 mg Oral Daily   docusate sodium  100 mg Oral BID   enoxaparin (LOVENOX) injection  30 mg Subcutaneous Q24H   traMADol  50 mg Oral Q6H   Continuous Infusions:  sodium chloride  75 mL/hr at 05/09/24 9047    Nutritional status     Body mass index is 18.56 kg/m.  Data Reviewed:   CBC: Recent Labs  Lab 05/07/24 1538 05/08/24 0442 05/09/24 0434  WBC 4.8 5.6 9.9  NEUTROABS 3.4  --   --   HGB 12.6 12.0 10.8*  HCT 39.4 37.5 33.3*  MCV 94.0 93.1 95.7  PLT 171 173 143*   Basic Metabolic Panel: Recent Labs  Lab 05/07/24 1538 05/08/24 0442 05/09/24 0434  NA 142 137 136  K 4.2 3.9 4.2  CL 106 104 103  CO2 23 24 24   GLUCOSE 101* 99 115*  BUN 21 13 21   CREATININE 0.70 0.54 0.88  CALCIUM 9.3 8.4* 8.0*   GFR: Estimated Creatinine Clearance: 34.1 mL/min (by C-G formula based on SCr of 0.88 mg/dL). Liver Function Tests: No results for input(s): AST, ALT, ALKPHOS, BILITOT, PROT, ALBUMIN in the last 168 hours. No results for input(s): LIPASE, AMYLASE in the last 168 hours. No results for input(s): AMMONIA in the last 168 hours. Coagulation Profile: Recent Labs  Lab 05/07/24 1538  INR 0.9   Cardiac Enzymes: No results for input(s): CKTOTAL, CKMB, CKMBINDEX, TROPONINI in the last 168 hours. BNP (last 3 results) No results for input(s): PROBNP in the last 8760 hours. HbA1C: No results for input(s): HGBA1C in the last 72 hours. CBG: No results for input(s): GLUCAP in the last 168 hours. Lipid Profile: No results for input(s): CHOL, HDL, LDLCALC, TRIG, CHOLHDL, LDLDIRECT in the last 72 hours. Thyroid  Function Tests: No results for input(s): TSH, T4TOTAL, FREET4, T3FREE, THYROIDAB in the last 72 hours. Anemia Panel: No results for input(s): VITAMINB12, FOLATE, FERRITIN, TIBC, IRON, RETICCTPCT in the last 72 hours. Sepsis Labs: No results  for input(s): PROCALCITON, LATICACIDVEN in the last 168 hours.  Recent Results (from the past 240 hours)  Surgical pcr screen     Status: None   Collection Time: 05/08/24  5:30 AM  Result Value Ref Range Status   MRSA, PCR NEGATIVE NEGATIVE Final   Staphylococcus aureus NEGATIVE NEGATIVE Final    Comment: (NOTE) The Xpert SA Assay (FDA approved for NASAL specimens in patients 32 years of age and older), is one component of a comprehensive surveillance program. It is not intended to diagnose infection nor to guide or monitor treatment. Performed at [facility], [address], [place], [place] [address]          Radiology Studies: DG HIP UNILAT W OR W/O PELVIS 2-3 VIEWS RIGHT Result Date: 05/08/2024 CLINICAL DATA:  Postop EXAM: DG HIP (WITH OR WITHOUT PELVIS) 2-3V RIGHT COMPARISON:  05/07/2024 FINDINGS: Interval right hip replacement with normal alignment. Gas in the soft tissues consistent with recent surgery IMPRESSION: Status post right  hip replacement with expected postsurgical change. Electronically Signed   By: Luke Bun M.D.   On: 05/08/2024 16:09   CT HEAD WO CONTRAST ( ) Result Date: 05/07/2024 EXAM: CT HEAD WITHOUT CONTRAST 05/07/2024 07:24:49 PM TECHNIQUE: CT of the head was performed without the administration of intravenous contrast. Automated exposure control, iterative reconstruction, and/or weight based adjustment of the mA/kV was utilized to reduce the radiation dose to as low as reasonably achievable. COMPARISON: None available. CLINICAL HISTORY: Fall, right hip fracture, dizziness after fall. FINDINGS: BRAIN AND VENTRICLES: No acute hemorrhage. No evidence of acute infarct. No hydrocephalus. No extra-axial collection. No mass effect or midline shift. Patchy and confluent areas of decreased attenuation are noted throughout the deep and periventricular white matter of the cerebral hemispheres bilaterally, suggestive of chronic microvascular ischemic changes.  Calcified atherosclerotic plaque within cavernous/supraclinoid internal carotid arteries. ORBITS: Bilateral lens replacements. SINUSES: No acute abnormality. SOFT TISSUES AND SKULL: No acute soft tissue abnormality. No skull fracture. IMPRESSION: 1. No acute intracranial abnormality. Electronically signed by: Morgane Naveau MD 05/07/2024 07:33 PM EST RP Workstation: [id number]   DG Chest 1 View Result Date: 05/07/2024 EXAM: 1 VIEW(S) XRAY OF THE CHEST 05/07/2024 03:16:00 PM COMPARISON: 12/17/2017 CLINICAL HISTORY: hip pain after fall FINDINGS: LUNGS AND PLEURA: Emphysema. No focal pulmonary opacity. No pulmonary edema. No pleural effusion. No pneumothorax. HEART AND MEDIASTINUM: No acute abnormality of the cardiac and mediastinal silhouettes. BONES AND SOFT TISSUES: Minimal dextrocurvature of the Thoracic spine. IMPRESSION: 1. Emphysema. No acute cardiopulmonary abnormality. Electronically signed by: Rogelia Myers MD 05/07/2024 03:51 PM EST RP Workstation: [id number]   DG Hip Unilat With Pelvis 2-3 Views Right Result Date: 05/07/2024 EXAM: 2 OR MORE VIEW(S) XRAY OF THE RIGHT HIP 05/07/2024 03:16:00 PM COMPARISON: None available. CLINICAL HISTORY: hip pain after fall FINDINGS: BONES AND JOINTS: Mildly impacted, superiorly displaced transcervical right femoral neck fracture with varus angulation. No dislocation. No associated pelvic bone fracture or pelvic bone diastasis. Osteopenia. SOFT TISSUES: Aortoiliac atherosclerosis. IMPRESSION: 1. Mildly impacted, superiorly displaced transcervical right femoral neck fracture with varus angulation. Electronically signed by: Rogelia Myers MD 05/07/2024 03:46 PM EST RP Workstation: [id number]       LOS: 2 days   Time spent= 35 mins    Deliliah Room, MD [facility]  If 7PM-7AM, please contact night-coverage  05/09/2024, 11:11 AM

## 2024-05-09 NOTE — Plan of Care (Signed)
  Problem: Acute Rehab PT Goals(only PT should resolve) Goal: Pt Will Go Supine/Side To Sit Outcome: Progressing Flowsheets (Taken 05/09/2024 1345) Pt will go Supine/Side to Sit:  with minimal assist  with contact guard assist Goal: Pt Will Go Sit To Supine/Side Outcome: Progressing Flowsheets (Taken 05/09/2024 1345) Pt will go Sit to Supine/Side:  with contact guard assist  with minimal assist Goal: Patient Will Perform Sitting Balance Outcome: Progressing Flowsheets (Taken 05/09/2024 1345) Patient will perform sitting balance:  with contact guard assist  with minimal assist Goal: Patient Will Transfer Sit To/From Stand Outcome: Progressing Flowsheets (Taken 05/09/2024 1345) Patient will transfer sit to/from stand:  with contact guard assist  with minimal assist Goal: Pt Will Transfer Bed To Chair/Chair To Bed Outcome: Progressing Flowsheets (Taken 05/09/2024 1345) Pt will Transfer Bed to Chair/Chair to Bed:  with contact guard assist  with min assist Goal: Pt Will Perform Standing Balance Or Pre-Gait Outcome: Progressing Flowsheets (Taken 05/09/2024 1345) Pt will perform standing balance or pre-gait:  with contact guard assist  with minimal assist  with no UE support Note: W/ RW Goal: Pt Will Ambulate Outcome: Progressing Flowsheets (Taken 05/09/2024 1345) Pt will Ambulate:  25 feet  50 feet  with contact guard assist  with minimal assist  with rolling walker     Javier Gell, SPT

## 2024-05-09 NOTE — Evaluation (Signed)
 Physical Therapy Evaluation Patient Details Name: Chelsea Ramos MRN: [id number] DOB: [birth year] Today's Date: 05/09/2024  History of Present Illness  Chelsea Ramos is a 83 y.o. female with medical history significant for hypertension, COPD.  Patient presented to the ED with reports of a fall and subsequent right hip pain.  Patient lives alone, she ambulates without assistance or assistive devices, she reports some baseline gait problems over the past year.  She reports she was walking too fast to get to her phone that was ringing, lost her balance and fell.  She is unsure if she hit her head.  She reports dizziness when she tried to get up after she fell.    Clinical Impression  Pt. Presented with R hip closed fx WB AT, and general weakness in LE. Pt mentioned having mild pain w/ R. Hip  which was monitored during treatment session. Pt. Was able to perform bed mobility w/ Min A/ Mod A  w/ LE to EOB. During transfer from bed to chair and side step ambulation pt. required RW w/ Min A/ Mod A. Pt was left in chair w/ call bell and  on room air. Nursing staff was notified on pt. Status. Patient will benefit from continued skilled physical therapy in hospital and recommended venue below to increase strength, balance, endurance for safe ADLs and gait.       If plan is discharge home, recommend the following: A little help with walking and/or transfers;Assistance with cooking/housework;Assist for transportation;A little help with bathing/dressing/bathroom;Help with stairs or ramp for entrance   Can travel by private vehicle   No    Equipment Recommendations None recommended by PT  Recommendations for Other Services       Functional Status Assessment Patient has had a recent decline in their functional status and demonstrates the ability to make significant improvements in function in a reasonable and predictable amount of time.     Precautions / Restrictions Precautions Precautions: Fall Recall of  Precautions/Restrictions: Intact Restrictions Weight Bearing Restrictions Per Provider Order: Yes RLE Weight Bearing Per Provider Order: Weight bearing as tolerated      Mobility  Bed Mobility Overal bed mobility: Needs Assistance Bed Mobility: Supine to Sit     Supine to sit: Min assist, Mod assist     General bed mobility comments: to EOB    Transfers Overall transfer level: Needs assistance Equipment used: Rolling walker (2 wheels) Transfers: Sit to/from Stand, Bed to chair/wheelchair/BSC Sit to Stand: Min assist, Mod assist   Step pivot transfers: Min assist, Mod assist       General transfer comment: w/ RW    Ambulation/Gait Ambulation/Gait assistance: Min assist, Mod assist Gait Distance (Feet): 7 Feet Assistive device: Rollator (4 wheels) Gait Pattern/deviations: Step-to pattern, Decreased step length - right, Decreased step length - left, Decreased stance time - right, Decreased stance time - left, Decreased stride length, Narrow base of support       General Gait Details: Pt. was able to perform side step ambulation w/ RW. Pt was in pain during ambulation and wanted to be seated towards the end. Montior vitals on room air and reminded >91%  Stairs            Wheelchair Mobility     Tilt Bed    Modified Rankin (Stroke Patients Only)       Balance Overall balance assessment: Needs assistance Sitting-balance support: No upper extremity supported, Feet supported Sitting balance-Leahy Scale: Fair Sitting balance - Comments: Fair to  EOB   Standing balance support: During functional activity, Reliant on assistive device for balance Standing balance-Leahy Scale: Fair Standing balance comment: Fair/ poor w/ RW                             Pertinent Vitals/Pain Pain Assessment Pain Assessment: 0-10 Pain Score: 4  Pain Location: R. Hip Pain Descriptors / Indicators: Discomfort, Dull Pain Intervention(s): Limited activity within  patient's tolerance, Monitored during session, Repositioned    Home Living Family/patient expects to be discharged to:: Private residence Living Arrangements: Alone Available Help at Discharge: Family Type of Home: House Home Access: Stairs to enter Entrance Stairs-Rails: None Entrance Stairs-Number of Steps: 2   Home Layout: One level Home Equipment: Agricultural Consultant (2 wheels);Cane - quad;Cane - single point;Rollator (4 wheels)      Prior Function Prior Level of Function : Driving;Independent/Modified Independent             Mobility Comments: WNL,assistance from family if needed ADLs Comments: WNL, assistance from family if needed     Extremity/Trunk Assessment        Lower Extremity Assessment Lower Extremity Assessment: Generalized weakness    Cervical / Trunk Assessment Cervical / Trunk Assessment: Normal  Communication   Communication Communication: No apparent difficulties    Cognition Arousal: Alert Behavior During Therapy: WFL for tasks assessed/performed   PT - Cognitive impairments: No apparent impairments                         Following commands: Intact       Cueing Cueing Techniques: Verbal cues, Tactile cues     General Comments General comments (skin integrity, edema, etc.): Montior vitals reminded >91% on room air    Exercises     Assessment/Plan    PT Assessment Patient needs continued PT services  PT Problem List Decreased strength;Decreased range of motion;Decreased activity tolerance;Decreased balance;Decreased coordination;Decreased mobility;Decreased safety awareness       PT Treatment Interventions DME instruction;Gait training;Stair training;Patient/family education;Functional mobility training;Therapeutic activities;Therapeutic exercise;Balance training    PT Goals (Current goals can be found in the Care Plan section)  Acute Rehab PT Goals Patient Stated Goal: pt. want to return home PT Goal Formulation: With  patient Time For Goal Achievement: 05/19/24 Potential to Achieve Goals: Good    Frequency Min 3X/week     Co-evaluation               AM-PAC PT 6 Clicks Mobility  Outcome Measure Help needed turning from your back to your side while in a flat bed without using bedrails?: A Lot Help needed moving from lying on your back to sitting on the side of a flat bed without using bedrails?: A Lot Help needed moving to and from a bed to a chair (including a wheelchair)?: A Little Help needed standing up from a chair using your arms (e.g., wheelchair or bedside chair)?: A Little Help needed to walk in hospital room?: A Lot Help needed climbing 3-5 steps with a railing? : Total 6 Click Score: 13    End of Session   Activity Tolerance: Patient tolerated treatment well;Patient limited by pain Patient left: in chair;with call bell/phone within reach Nurse Communication: Mobility status PT Visit Diagnosis: Pain;Muscle weakness (generalized) (M62.81);Repeated falls (R29.6) Pain - Right/Left: Right Pain - part of body: Hip    Time: 0822-0853 PT Time Calculation (min) (ACUTE ONLY): 31 min   Charges:  PT Evaluation $PT Eval Moderate Complexity: 1 Mod PT Treatments $Therapeutic Activity: 23-37 mins PT General Charges $$ ACUTE PT VISIT: 1 Visit        Arti Trang, SPT

## 2024-05-09 NOTE — Progress Notes (Signed)
 Mobility Specialist Progress Note:    05/09/24 1440  Mobility  Activity Ambulated with assistance  Level of Assistance Minimal assist, patient does 75% or more  Assistive Device Front wheel walker  Distance Ambulated (ft) 12 ft  Range of Motion/Exercises Active;All extremities  RLE Weight Bearing Per Provider Order WBAT  Activity Response Tolerated well  Mobility Referral Yes  Mobility visit 1 Mobility  Mobility Specialist Start Time (ACUTE ONLY) 1440  Mobility Specialist Stop Time (ACUTE ONLY) 1502  Mobility Specialist Time Calculation (min) (ACUTE ONLY) 22 min   Pt received in chair, requesting to ambulate. Required MinA to stand and ambulate with RW. Tolerated well, SpO2 90-92% on RA during ambulation. Returned to chair, family in room. All needs met.  Annaka Cleaver Mobility Specialist Please contact via Special Educational Needs Teacher or  Rehab office at [phone]

## 2024-05-09 NOTE — Progress Notes (Signed)
 Patient ID: Chelsea Ramos, female   DOB: 1940-09-07, 83 y.o.   MRN: [id number]   Postop day 1 chart review.  Hemoglobin 10.8 after bipolar replacement right hip  Okay to proceed with physical therapy weight-bear as tolerated direct lateral hip precautions

## 2024-05-09 NOTE — [place] FL2 (Signed)
 [place]  MEDICAID FL2 LEVEL OF CARE FORM     IDENTIFICATION  Patient Name: Chelsea Ramos Birthdate: 01-Oct-1940 Sex: female Admission Date (Current Location): 05/07/2024  [facility] and [place] Number:  [employer] and Address:  [facility],  [address] [place] [address]      Provider Number: [id number]  Attending Physician Name and Address:  Dino Antu, MD  Relative Name and Phone Number:       Current Level of Care: Hospital Recommended Level of Care: Skilled Nursing Facility Prior Approval Number:    Date Approved/Denied:   PASRR Number:    Discharge Plan: SNF    Current Diagnoses: Patient Active Problem List   Diagnosis Date Noted   Closed fracture of neck of right femur (HCC) 05/07/2024   COPD (chronic obstructive pulmonary disease) (HCC) 05/07/2024   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/2024   S/P tympanic tube insertion 04/06/2024   Ventral hernia without obstruction or gangrene 02/02/2023   RLQ abdominal pain 02/02/2023   Vaginal discharge 02/02/2023   Diverticulosis of colon without hemorrhage    Hypertension 02/06/2013   Arthritis 02/06/2013    Orientation RESPIRATION BLADDER Height & Weight     Self, Time, Situation, Place  Normal Continent Weight: 95 lb 0.3 oz (43.1 kg) Height:  5' (152.4 cm)  BEHAVIORAL SYMPTOMS/MOOD NEUROLOGICAL BOWEL NUTRITION STATUS      Continent Diet (See D/C summary)  AMBULATORY STATUS COMMUNICATION OF NEEDS Skin   Extensive Assist Verbally Normal                       Personal Care Assistance Level of Assistance  Bathing, Feeding, Dressing Bathing Assistance: Limited assistance Feeding assistance: Independent Dressing Assistance: Limited assistance     Functional Limitations Info  Sight, Hearing, Speech Sight Info: Impaired Hearing Info: Adequate Speech Info: Adequate    SPECIAL CARE FACTORS FREQUENCY  PT (By licensed PT), OT (By licensed OT)     PT Frequency: 5 times weekly OT  Frequency: 5 times weekly            Contractures Contractures Info: Not present    Additional Factors Info  Code Status, Allergies Code Status Info: FULL Allergies Info: Losartan and Prednisone           Current Medications (05/09/2024):  This is the current hospital active medication list Current Facility-Administered Medications  Medication Dose Route Frequency Provider Last Rate Last Admin   0.9 %  sodium chloride  infusion   Intravenous Continuous Margrette Taft BRAVO, MD 75 mL/hr at 05/09/24 0952 New Bag at 05/09/24 0952   acetaminophen (TYLENOL) tablet 650 mg  650 mg Oral Q6H PRN Harrison, Stanley E, MD       Or   acetaminophen (TYLENOL) suppository 650 mg  650 mg Rectal Q6H PRN Harrison, Stanley E, MD       amLODipine (NORVASC) tablet 5 mg  5 mg Oral Daily Harrison, Stanley E, MD       docusate sodium (COLACE) capsule 100 mg  100 mg Oral BID Harrison, Stanley E, MD   100 mg at 05/09/24 0944   enoxaparin (LOVENOX) injection 30 mg  30 mg Subcutaneous Q24H Harrison, Stanley E, MD   30 mg at 05/09/24 0945   ipratropium-albuterol (DUONEB) 0.5-2.5 (3) MG/3ML nebulizer solution 3 mL  3 mL Nebulization Q4H PRN Harrison, Stanley E, MD       LORazepam (ATIVAN) tablet 0.25 mg  0.25 mg Oral Q8H PRN Margrette Taft BRAVO, MD  menthol (CEPACOL) lozenge 3 mg  1 lozenge Oral PRN Harrison, Stanley E, MD       Or   phenol (CHLORASEPTIC) mouth spray 1 spray  1 spray Mouth/Throat PRN Margrette Taft BRAVO, MD       methocarbamol (ROBAXIN) tablet 500 mg  500 mg Oral Q6H PRN Harrison, Stanley E, MD   500 mg at 05/09/24 0944   Or   methocarbamol (ROBAXIN) injection 500 mg  500 mg Intravenous Q6H PRN Harrison, Stanley E, MD       metoCLOPramide (REGLAN) tablet 5-10 mg  5-10 mg Oral Q8H PRN Harrison, Stanley E, MD       Or   metoCLOPramide (REGLAN) injection 5-10 mg  5-10 mg Intravenous Q8H PRN Harrison, Stanley E, MD       morphine (PF) 2 MG/ML injection 2 mg  2 mg Intravenous Q2H PRN Harrison,  Stanley E, MD       ondansetron  (ZOFRAN ) tablet 4 mg  4 mg Oral Q6H PRN Margrette Taft BRAVO, MD       Or   ondansetron  (ZOFRAN ) injection 4 mg  4 mg Intravenous Q6H PRN Harrison, Stanley E, MD       senna-docusate (Senokot-S) tablet 1 tablet  1 tablet Oral QHS PRN Harrison, Stanley E, MD       traMADol (ULTRAM) tablet 50 mg  50 mg Oral Q6H Harrison, Stanley E, MD   50 mg at 05/09/24 1154     Discharge Medications: Please see discharge summary for a list of discharge medications.  Relevant Imaging Results:  Relevant Lab Results:   Additional Information SSN: [id number], [place]

## 2024-05-09 NOTE — Progress Notes (Signed)
 Patient ambulating with mobility tech in room, using walker

## 2024-05-10 DIAGNOSIS — J449 Chronic obstructive pulmonary disease, unspecified: Secondary | ICD-10-CM | POA: Diagnosis not present

## 2024-05-10 DIAGNOSIS — S72001D Fracture of unspecified part of neck of right femur, subsequent encounter for closed fracture with routine healing: Secondary | ICD-10-CM

## 2024-05-10 DIAGNOSIS — I10 Essential (primary) hypertension: Secondary | ICD-10-CM | POA: Diagnosis not present

## 2024-05-10 LAB — BASIC METABOLIC PANEL WITH GFR
Anion gap: 8 (ref 5–15)
BUN: 18 mg/dL (ref 8–23)
CO2: 24 mmol/L (ref 22–32)
Calcium: 7.8 mg/dL — ABNORMAL LOW (ref 8.9–10.3)
Chloride: 102 mmol/L (ref 98–111)
Creatinine, Ser: 0.51 mg/dL (ref 0.44–1.00)
GFR, Estimated: >60 mL/min (ref >60)
Glucose, Bld: 94 mg/dL (ref 70–99)
Potassium: 4 mmol/L (ref 3.5–5.1)
Sodium: 134 mmol/L — ABNORMAL LOW (ref 135–145)

## 2024-05-10 LAB — CBC
HCT: 30.7 % — ABNORMAL LOW (ref 36.0–46.0)
Hemoglobin: 9.7 g/dL — ABNORMAL LOW (ref 12.0–15.0)
MCH: 30.2 pg (ref 26.0–34.0)
MCHC: 31.6 g/dL (ref 30.0–36.0)
MCV: 95.6 fL (ref 80.0–100.0)
Platelets: 125 K/uL — ABNORMAL LOW (ref 150–400)
RBC: 3.21 MIL/uL — ABNORMAL LOW (ref 3.87–5.11)
RDW: 17 % — ABNORMAL HIGH (ref 11.5–15.5)
WBC: 9 K/uL (ref 4.0–10.5)
nRBC: 0 % (ref 0.0–0.2)

## 2024-05-10 MED ORDER — TRAMADOL HCL 50 MG PO TABS: 50.0000 mg | ORAL_TABLET | Freq: Three times a day (TID) | ORAL | 0 refills | Status: AC | PRN

## 2024-05-10 MED ORDER — DOCUSATE SODIUM 100 MG PO CAPS
100.0000 mg | ORAL_CAPSULE | Freq: Two times a day (BID) | ORAL | Status: AC
Start: 2024-05-10 — End: ?

## 2024-05-10 MED ORDER — IPRATROPIUM-ALBUTEROL 0.5-2.5 (3) MG/3ML IN SOLN: 3.0000 mL | RESPIRATORY_TRACT | Status: AC | PRN

## 2024-05-10 MED ORDER — ASPIRIN 81 MG PO TABS: 81.0000 mg | ORAL_TABLET | Freq: Every day | ORAL | Status: AC

## 2024-05-10 MED ORDER — PANTOPRAZOLE SODIUM 40 MG PO TBEC: 40.0000 mg | DELAYED_RELEASE_TABLET | Freq: Every evening | ORAL | Status: AC

## 2024-05-10 MED ORDER — FUROSEMIDE 20 MG PO TABS: 20.0000 mg | ORAL_TABLET | ORAL | Status: AC | PRN

## 2024-05-10 MED ORDER — MENTHOL 3 MG MT LOZG: 1.0000 | LOZENGE | OROMUCOSAL | Status: AC | PRN

## 2024-05-10 MED ORDER — ACETAMINOPHEN 325 MG PO TABS: 650.0000 mg | ORAL_TABLET | Freq: Three times a day (TID) | ORAL | Status: AC

## 2024-05-10 NOTE — Progress Notes (Signed)
 Subjective: 2 Days Post-Op Procedure(s) (LRB): HEMIARTHROPLASTY (BIPOLAR) HIP, lateral  APPROACH FOR FRACTURE (Right) Patient reports pain as mild.    Objective: Vital signs in last 24 hours: Temp:  [98.1 F (36.7 C)-98.9 F (37.2 C)] 98.9 F (37.2 C) (11/13 0546) Pulse Rate:  [91-108] 108 (11/13 0546) Resp:  [16-20] 18 (11/13 0546) BP: (102-119)/(56-61) 110/56 (11/13 0546) SpO2:  [91 %-99 %] 91 % (11/13 0546)  Intake/Output from previous day: 11/12 0701 - 11/13 0700 In: -  Out: 525 [Urine:525] Intake/Output this shift: No intake/output data recorded.  Recent Labs    05/07/24 1538 05/08/24 0442 05/09/24 0434 05/10/24 0429  HGB 12.6 12.0 10.8* 9.7*   Recent Labs    05/09/24 0434 05/10/24 0429  WBC 9.9 9.0  RBC 3.48* 3.21*  HCT 33.3* 30.7*  PLT 143* 125*   Recent Labs    05/09/24 0434 05/10/24 0429  NA 136 134*  K 4.2 4.0  CL 103 102  CO2 24 24  BUN 21 18  CREATININE 0.88 0.51  GLUCOSE 115* 94  CALCIUM 8.0* 7.8*   Recent Labs    05/07/24 1538  INR 0.9    Neurologically intact Sensation intact distally Intact pulses distally Dorsiflexion/Plantar flexion intact Incision: no drainage   Assessment/Plan: 2 Days Post-Op Procedure(s) (LRB): HEMIARTHROPLASTY (BIPOLAR) HIP, lateral  APPROACH FOR FRACTURE (Right) Advance diet Up with therapy D/C IV fluids Discharge to SNF when appropriate   Instructions for orthopedic care Take the staples out on postop day 14 Weight-bear as tolerated Direct lateral hip precautions Dressing can stay on until postop day 14, unless it becomes completely soaked DVT prophylaxis aspirin 81 mg daily Follow-up 4 weeks   Taft Minerva 05/10/2024, 11:16 AM

## 2024-05-10 NOTE — Progress Notes (Signed)
 Physical Therapy Treatment Patient Details Name: Chelsea Ramos MRN: [id number] DOB: 05-29-1941 Today's Date: 05/10/2024   History of Present Illness Chelsea Ramos is a 83 y.o. female with medical history significant for hypertension, COPD.  Patient presented to the ED with reports of a fall and subsequent right hip pain.  Patient lives alone, she ambulates without assistance or assistive devices, she reports some baseline gait problems over the past year.  She reports she was walking too fast to get to her phone that was ringing, lost her balance and fell.  She is unsure if she hit her head.  She reports dizziness when she tried to get up after she fell. Patient s/pHEMIARTHROPLASTY (BIPOLAR) HIP, lateral  APPROACH FOR FRACTURE (Right) on 05/08/2024    PT Comments  Patient agreeable and motivated for therapy. Patient demonstrates slow labored movement for sitting up at bedside assistance for moving RLE due to pain and weakness. Patient demonstrates increased endurance/distance for gait training with fair carryover for right heel to toe stepping due to increasing pain and required active assistance for completing right hip raises, knee extension while completing exercises program. Patient tolerated sitting up in chair after therapy. Patient will benefit from continued skilled physical therapy in hospital and recommended venue below to increase strength, balance, endurance for safe ADLs and gait.       If plan is discharge home, recommend the following: Assistance with cooking/housework;Assist for transportation;Help with stairs or ramp for entrance;A lot of help with walking and/or transfers;A lot of help with bathing/dressing/bathroom   Can travel by private vehicle     No  Equipment Recommendations  None recommended by PT    Recommendations for Other Services       Precautions / Restrictions Precautions Precautions: Fall Recall of Precautions/Restrictions: Intact Restrictions Weight Bearing  Restrictions Per Provider Order: Yes RLE Weight Bearing Per Provider Order: Weight bearing as tolerated     Mobility  Bed Mobility Overal bed mobility: Needs Assistance Bed Mobility: Supine to Sit     Supine to sit: Min assist, Mod assist, HOB elevated     General bed mobility comments: slow labored movement with HOB partially raised, required assistance for moving RLE    Transfers Overall transfer level: Needs assistance Equipment used: Rolling walker (2 wheels) Transfers: Sit to/from Stand, Bed to chair/wheelchair/BSC Sit to Stand: Min assist   Step pivot transfers: Min assist, Mod assist       General transfer comment: slow labored movement requiring verbal/tactile cueing for proper hand placement during stand to sitting    Ambulation/Gait Ambulation/Gait assistance: Min assist, Mod assist Gait Distance (Feet): 22 Feet Assistive device: Rolling walker (2 wheels) Gait Pattern/deviations: Decreased step length - right, Decreased step length - left, Decreased stride length, Antalgic, Trunk flexed Gait velocity: decreased     General Gait Details: slow labored movement with fair return for right heel to toe stepping due to pain, increased endurance/distance, but limited mostly due to fatigue   Stairs             Wheelchair Mobility     Tilt Bed    Modified Rankin (Stroke Patients Only)       Balance Overall balance assessment: Needs assistance Sitting-balance support: Feet supported, No upper extremity supported Sitting balance-Leahy Scale: Fair Sitting balance - Comments: fair/good seated at EOB   Standing balance support: Reliant on assistive device for balance, During functional activity, Bilateral upper extremity supported Standing balance-Leahy Scale: Poor Standing balance comment: fair/poor using RW  Communication Communication Communication: Impaired Factors Affecting Communication: Hearing impaired   Cognition Arousal: Alert Behavior During Therapy: WFL for tasks assessed/performed                             Following commands: Intact      Cueing Cueing Techniques: Verbal cues, Tactile cues  Exercises General Exercises - Lower Extremity Long Arc Quad: Seated, AROM, AAROM, Strengthening, Both, 10 reps Hip Flexion/Marching: Seated, AROM, AAROM, Strengthening, Both, 10 reps Toe Raises: Seated, AROM, Strengthening, Both, 10 reps Heel Raises: Seated, AROM, Strengthening, Both, 10 reps    General Comments        Pertinent Vitals/Pain Pain Assessment Pain Assessment: 0-10 Pain Score: 4  Pain Location: R. Hip Pain Descriptors / Indicators: Discomfort, Sore Pain Intervention(s): Limited activity within patient's tolerance, Monitored during session, Repositioned    Home Living                          Prior Function            PT Goals (current goals can now be found in the care plan section) Acute Rehab PT Goals Patient Stated Goal: return home after rehab PT Goal Formulation: With patient Time For Goal Achievement: 05/19/24 Potential to Achieve Goals: Good Progress towards PT goals: Progressing toward goals    Frequency    Min 3X/week      PT Plan      Co-evaluation              AM-PAC PT 6 Clicks Mobility   Outcome Measure  Help needed turning from your back to your side while in a flat bed without using bedrails?: A Lot Help needed moving from lying on your back to sitting on the side of a flat bed without using bedrails?: A Lot Help needed moving to and from a bed to a chair (including a wheelchair)?: A Lot Help needed standing up from a chair using your arms (e.g., wheelchair or bedside chair)?: A Little Help needed to walk in hospital room?: A Lot Help needed climbing 3-5 steps with a railing? : A Lot 6 Click Score: 13    End of Session   Activity Tolerance: Patient tolerated treatment well;Patient limited by  fatigue Patient left: in chair;with call bell/phone within reach Nurse Communication: Mobility status PT Visit Diagnosis: Pain;Muscle weakness (generalized) (M62.81);Repeated falls (R29.6);Unsteadiness on feet (R26.81) Pain - Right/Left: Right Pain - part of body: Hip     Time: 8894-8868 PT Time Calculation (min) (ACUTE ONLY): 26 min  Charges:    $Gait Training: 8-22 mins $Therapeutic Exercise: 8-22 mins PT General Charges $$ ACUTE PT VISIT: 1 Visit                     12:23 PM, 05/10/24 Lynwood Music, MPT Physical Therapist with [facility] [phone] office [phone] mobile phone

## 2024-05-10 NOTE — Discharge Summary (Signed)
 Physician Discharge Summary  Chelsea Ramos FMW:[id number] DOB: [birth year] DOA: 05/07/2024  PCP: Bevely Doffing, FNP  Admit date: 05/07/2024 Discharge date: 05/10/2024  Disposition:   SNF rehabilitation  Recommendations for Outpatient Follow-up:  Follow up with Dr Margrette orthopedics in 1 month  PLEASE REMOVE STAPLES ON 05/22/24  Please obtain BMP/CBC in 2 weeks  Instructions for orthopedic care Take the staples out on postop day 14 (05/22/24)  Weight-bear as tolerated Direct lateral hip precautions Dressing can stay on until postop day 14, unless it becomes completely soaked DVT prophylaxis aspirin 81 mg daily Follow-up 4 weeks with Dr Margrette  Home Health:  SNF  Discharge Condition: STABLE   CODE STATUS: FULL DIET: regular    Brief Hospitalization Summary: Please see all hospital notes, images, labs for full details of the hospitalization. Admission provider HPI: 83 y.o. female with medical history significant for hypertension, COPD.  Patient presented to the ED with reports of a fall and subsequent right hip pain.  Patient lives alone, she ambulates without assistance or assistive devices, she reports some baseline gait problems over the past year.Right hip x-ray shows mildly impacted displaced transcervical right femoral neck fracture. Varus angulation.  Ortho was consulted and she underwent surgical intervention on 05/08/2024.  PT OT on board.    Hospital Course by listed problems addressed   Close right hip fracture status post mechanical fall-status post right hip bipolar hemiarthroplasty, done on 05/08/2024, postop day 2 X-ray pelvis-  Mildly impacted, superiorly displaced transcervical right femoral neck fracture with varus angulation.   Chest x-ray EKG without acute abnormalities. - Orthopedics on board, outpatient follow-up with them on the scheduled appointment. - Continue with as needed analgesics - Continue with PT/OT and possible skilled nursing facility placement  on discharge. - added daily pantoprazole for GI protection while on daily aspirin  - acetaminophen orded 650 mg TID for pain management  - tramadol ordered PRN for severe pain only  Instructions for orthopedic care per Dr. Margrette Take the staples out on postop day 14 (05/22/24)  Weight-bear as tolerated Direct lateral hip precautions Dressing can stay on until postop day 14, unless it becomes completely soaked DVT prophylaxis aspirin 81 mg daily Follow-up 4 weeks with Dr Margrette    Mechanical fall, POA: I reviewed the CT of the head with did not show any acute intracranial abnormalities.  PT/OT with recommendation for SNF rehab placement    COPD - As needed inhalational bronchodilator therapy   Hypertension - Resumed Norvasc 5 mg   Discharge Diagnoses:  Principal Problem:   Closed fracture of neck of right femur (HCC) Active Problems:   Hypertension   COPD (chronic obstructive pulmonary disease) (HCC)   Discharge Instructions:  Allergies as of 05/10/2024       Reactions   Losartan Itching   Prednisone Itching        Medication List     STOP taking these medications    LORazepam 0.5 MG tablet Commonly known as: ATIVAN   naproxen sodium 220 MG tablet Commonly known as: ALEVE       TAKE these medications    acetaminophen 325 MG tablet Commonly known as: TYLENOL Take 2 tablets (650 mg total) by mouth in the morning, at noon, and at bedtime.   amLODipine 5 MG tablet Commonly known as: NORVASC Take 1 tablet (5 mg total) by mouth daily.   aspirin 81 MG tablet Take 1 tablet (81 mg total) by mouth daily. Start taking on: May 11, 2024 What  changed: when to take this   docusate sodium 100 MG capsule Commonly known as: COLACE Take 1 capsule (100 mg total) by mouth 2 (two) times daily.   furosemide 20 MG tablet Commonly known as: LASIX Take 1 tablet (20 mg total) by mouth every other day as needed for fluid or edema. What changed:  when to take  this reasons to take this   ipratropium-albuterol 0.5-2.5 (3) MG/3ML Soln Commonly known as: DUONEB Take 3 mLs by nebulization every 4 (four) hours as needed.   LUTEIN PO Take 1 capsule by mouth daily.   menthol 3 MG lozenge Commonly known as: CEPACOL Take 1 lozenge (3 mg total) by mouth as needed for sore throat.   multivitamin capsule Take 2 capsules by mouth daily.   pantoprazole 40 MG tablet Commonly known as: Protonix Take 1 tablet (40 mg total) by mouth every evening.   traMADol 50 MG tablet Commonly known as: ULTRAM Take 1 tablet (50 mg total) by mouth every 8 (eight) hours as needed for up to 3 days for severe pain (pain score 7-10).   VITAMIN C PO Take 1 tablet by mouth daily.   VITAMIN D-3 PO Take 1 tablet by mouth daily.        Contact information for follow-up providers     Margrette Taft BRAVO, MD. Schedule an appointment as soon as possible for a visit in 1 month(s).   Specialties: Orthopedic Surgery, Radiology Why: Hospital Follow Up, For wound re-check Contact information: [address] [place] [address] [phone]              Contact information for after-discharge care     Destination     [facility] .   Service: Skilled Nursing Contact information: [address] [place]  [address] [phone]                    Allergies  Allergen Reactions   Losartan Itching   Prednisone Itching   Allergies as of 05/10/2024       Reactions   Losartan Itching   Prednisone Itching        Medication List     STOP taking these medications    LORazepam 0.5 MG tablet Commonly known as: ATIVAN   naproxen sodium 220 MG tablet Commonly known as: ALEVE       TAKE these medications    acetaminophen 325 MG tablet Commonly known as: TYLENOL Take 2 tablets (650 mg total) by mouth in the morning, at noon, and at bedtime.   amLODipine 5 MG  tablet Commonly known as: NORVASC Take 1 tablet (5 mg total) by mouth daily.   aspirin 81 MG tablet Take 1 tablet (81 mg total) by mouth daily. Start taking on: May 11, 2024 What changed: when to take this   docusate sodium 100 MG capsule Commonly known as: COLACE Take 1 capsule (100 mg total) by mouth 2 (two) times daily.   furosemide 20 MG tablet Commonly known as: LASIX Take 1 tablet (20 mg total) by mouth every other day as needed for fluid or edema. What changed:  when to take this reasons to take this   ipratropium-albuterol 0.5-2.5 (3) MG/3ML Soln Commonly known as: DUONEB Take 3 mLs by nebulization every 4 (four) hours as needed.   LUTEIN PO Take 1 capsule by mouth daily.   menthol 3 MG lozenge Commonly known as: CEPACOL Take 1 lozenge (3 mg total) by mouth  as needed for sore throat.   multivitamin capsule Take 2 capsules by mouth daily.   pantoprazole 40 MG tablet Commonly known as: Protonix Take 1 tablet (40 mg total) by mouth every evening.   traMADol 50 MG tablet Commonly known as: ULTRAM Take 1 tablet (50 mg total) by mouth every 8 (eight) hours as needed for up to 3 days for severe pain (pain score 7-10).   VITAMIN C PO Take 1 tablet by mouth daily.   VITAMIN D-3 PO Take 1 tablet by mouth daily.        Procedures/Studies: DG HIP UNILAT W OR W/O PELVIS 2-3 VIEWS RIGHT Result Date: 05/08/2024 CLINICAL DATA:  Postop EXAM: DG HIP (WITH OR WITHOUT PELVIS) 2-3V RIGHT COMPARISON:  05/07/2024 FINDINGS: Interval right hip replacement with normal alignment. Gas in the soft tissues consistent with recent surgery IMPRESSION: Status post right hip replacement with expected postsurgical change. Electronically Signed   By: Luke Bun M.D.   On: 05/08/2024 16:09   CT HEAD WO CONTRAST ( ) Result Date: 05/07/2024 EXAM: CT HEAD WITHOUT CONTRAST 05/07/2024 07:24:49 PM TECHNIQUE: CT of the head was performed without the administration of intravenous  contrast. Automated exposure control, iterative reconstruction, and/or weight based adjustment of the mA/kV was utilized to reduce the radiation dose to as low as reasonably achievable. COMPARISON: None available. CLINICAL HISTORY: Fall, right hip fracture, dizziness after fall. FINDINGS: BRAIN AND VENTRICLES: No acute hemorrhage. No evidence of acute infarct. No hydrocephalus. No extra-axial collection. No mass effect or midline shift. Patchy and confluent areas of decreased attenuation are noted throughout the deep and periventricular white matter of the cerebral hemispheres bilaterally, suggestive of chronic microvascular ischemic changes. Calcified atherosclerotic plaque within cavernous/supraclinoid internal carotid arteries. ORBITS: Bilateral lens replacements. SINUSES: No acute abnormality. SOFT TISSUES AND SKULL: No acute soft tissue abnormality. No skull fracture. IMPRESSION: 1. No acute intracranial abnormality. Electronically signed by: Morgane Naveau MD 05/07/2024 07:33 PM EST RP Workstation: [id number]   DG Chest 1 View Result Date: 05/07/2024 EXAM: 1 VIEW(S) XRAY OF THE CHEST 05/07/2024 03:16:00 PM COMPARISON: 12/17/2017 CLINICAL HISTORY: hip pain after fall FINDINGS: LUNGS AND PLEURA: Emphysema. No focal pulmonary opacity. No pulmonary edema. No pleural effusion. No pneumothorax. HEART AND MEDIASTINUM: No acute abnormality of the cardiac and mediastinal silhouettes. BONES AND SOFT TISSUES: Minimal dextrocurvature of the Thoracic spine. IMPRESSION: 1. Emphysema. No acute cardiopulmonary abnormality. Electronically signed by: Rogelia Myers MD 05/07/2024 03:51 PM EST RP Workstation: [id number]   DG Hip Unilat With Pelvis 2-3 Views Right Result Date: 05/07/2024 EXAM: 2 OR MORE VIEW(S) XRAY OF THE RIGHT HIP 05/07/2024 03:16:00 PM COMPARISON: None available. CLINICAL HISTORY: hip pain after fall FINDINGS: BONES AND JOINTS: Mildly impacted, superiorly displaced transcervical right femoral neck  fracture with varus angulation. No dislocation. No associated pelvic bone fracture or pelvic bone diastasis. Osteopenia. SOFT TISSUES: Aortoiliac atherosclerosis. IMPRESSION: 1. Mildly impacted, superiorly displaced transcervical right femoral neck fracture with varus angulation. Electronically signed by: Rogelia Myers MD 05/07/2024 03:46 PM EST RP Workstation: [id number]     Subjective: Pt reports no complaints, agreeable to SNF  placement  Discharge Exam: Vitals:   05/09/24 2135 05/10/24 0546  BP: 119/61 (!) 110/56  Pulse: (!) 102 (!) 108  Resp: 20 18  Temp: 98.1 F (36.7 C) 98.9 F (37.2 C)  SpO2: 99% 91%   Vitals:   05/09/24 1500 05/09/24 1734 05/09/24 2135 05/10/24 0546  BP:   119/61 (!) 110/56  Pulse:   (!) 102 (!) 108  Resp:  20 18  Temp:   98.1 F (36.7 C) 98.9 F (37.2 C)  TempSrc:   Oral Oral  SpO2: 91% 92% 99% 91%  Weight:      Height:       General: Pt is alert, awake, not in acute distress Cardiovascular: normal S1/S2 +, no rubs, no gallops Respiratory: CTA bilaterally, no wheezing, no rhonchi Abdominal: Soft, NT, ND, bowel sounds + Extremities: wound clean dry intact and healing well, warm and palpable pulses in distal LEs   The results of significant diagnostics from this hospitalization (including imaging, microbiology, ancillary and laboratory) are listed below for reference.     Microbiology: Recent Results (from the past 240 hours)  Surgical pcr screen     Status: None   Collection Time: 05/08/24  5:30 AM  Result Value Ref Range Status   MRSA, PCR NEGATIVE NEGATIVE Final   Staphylococcus aureus NEGATIVE NEGATIVE Final    Comment: (NOTE) The Xpert SA Assay (FDA approved for NASAL specimens in patients 38 years of age and older), is one component of a comprehensive surveillance program. It is not intended to diagnose infection nor to guide or monitor treatment. Performed at [facility], [address] [place], [place] [address]       Labs: BNP (last 3 results) No results for input(s): BNP in the last 8760 hours. Basic Metabolic Panel: Recent Labs  Lab 05/07/24 1538 05/08/24 0442 05/09/24 0434 05/10/24 0429  NA 142 137 136 134*  K 4.2 3.9 4.2 4.0  CL 106 104 103 102  CO2 23 24 24 24   GLUCOSE 101* 99 115* 94  BUN 21 13 21 18   CREATININE 0.70 0.54 0.88 0.51  CALCIUM 9.3 8.4* 8.0* 7.8*   Liver Function Tests: No results for input(s): AST, ALT, ALKPHOS, BILITOT, PROT, ALBUMIN in the last 168 hours. No results for input(s): LIPASE, AMYLASE in the last 168 hours. No results for input(s): AMMONIA in the last 168 hours. CBC: Recent Labs  Lab 05/07/24 1538 05/08/24 0442 05/09/24 0434 05/10/24 0429  WBC 4.8 5.6 9.9 9.0  NEUTROABS 3.4  --   --   --   HGB 12.6 12.0 10.8* 9.7*  HCT 39.4 37.5 33.3* 30.7*  MCV 94.0 93.1 95.7 95.6  PLT 171 173 143* 125*   Cardiac Enzymes: No results for input(s): CKTOTAL, CKMB, CKMBINDEX, TROPONINI in the last 168 hours. BNP: Invalid input(s): POCBNP CBG: No results for input(s): GLUCAP in the last 168 hours. D-Dimer No results for input(s): DDIMER in the last 72 hours. Hgb A1c No results for input(s): HGBA1C in the last 72 hours. Lipid Profile No results for input(s): CHOL, HDL, LDLCALC, TRIG, CHOLHDL, LDLDIRECT in the last 72 hours. Thyroid  function studies No results for input(s): TSH, T4TOTAL, T3FREE, THYROIDAB in the last 72 hours.  Invalid input(s): FREET3 Anemia work up No results for input(s): VITAMINB12, FOLATE, FERRITIN, TIBC, IRON, RETICCTPCT in the last 72 hours. Urinalysis No results found for: COLORURINE, APPEARANCEUR, LABSPEC, PHURINE, GLUCOSEU, HGBUR, BILIRUBINUR, KETONESUR, PROTEINUR, UROBILINOGEN, NITRITE, LEUKOCYTESUR Sepsis Labs Recent Labs  Lab 05/07/24 1538 05/08/24 0442 05/09/24 0434 05/10/24 0429  WBC 4.8 5.6 9.9 9.0   Microbiology Recent  Results (from the past 240 hours)  Surgical pcr screen     Status: None   Collection Time: 05/08/24  5:30 AM  Result Value Ref Range Status   MRSA, PCR NEGATIVE NEGATIVE Final   Staphylococcus aureus NEGATIVE NEGATIVE Final    Comment: (NOTE) The Xpert SA Assay (FDA approved for NASAL  specimens in patients 46 years of age and older), is one component of a comprehensive surveillance program. It is not intended to diagnose infection nor to guide or monitor treatment. Performed at [facility], [address], [place], [place] [address]     Time coordinating discharge: 36 mins   SIGNED:  Afton Louder, MD  [facility] 05/10/2024, 11:39 AM How to contact the [facility] Attending or Consulting provider 7A - 7P or covering provider during after hours 7P -7A, for this patient?  Check the care team in [facility] and look for a) attending/consulting TRH provider listed and b) the TRH team listed Log into [email or web address] and use [facility]'s universal password to access. If you do not have the password, please contact the hospital operator. Locate the TRH provider you are looking for under [facility] and page to a number that you can be directly reached. If you still have difficulty reaching the provider, please page the [facility] (Director on Call) for the Hospitalists listed on amion for assistance.

## 2024-05-10 NOTE — Hospital Course (Signed)
 83 y.o. female with medical history significant for hypertension, COPD.  Patient presented to the ED with reports of a fall and subsequent right hip pain.  Patient lives alone, she ambulates without assistance or assistive devices, she reports some baseline gait problems over the past year.Right hip x-ray shows mildly impacted displaced transcervical right femoral neck fracture. Varus angulation.  Ortho was consulted and she underwent surgical intervention on 05/08/2024.  PT OT on board.

## 2024-05-10 NOTE — TOC Transition Note (Signed)
 Transition of Care [facility]) - Discharge Note   Patient Details  Name: Chelsea Ramos MRN: [id number] Date of Birth: 03-18-1941  Transition of [facility]) CM/SW Contact:  [name], LCSW Phone Number: 05/10/2024, 12:56 PM   Clinical Narrative:    Pt to discharge to [facility] for STR. Pt will be going to room A7-2. RN to call report to [phone]. Pt and daughter agreeable to discharge plans. [facility] called at 12:56pm for transportation.    Final next level of care: Skilled Nursing Facility Barriers to Discharge: Barriers Resolved   Patient Goals and CMS Choice Patient states their goals for this hospitalization and ongoing recovery are:: Get stronger CMS Medicare.gov Compare Post Acute Care list provided to:: Patient Choice offered to / list presented to : Patient, Adult Children [place] ownership interest in [facility].provided to:: Patient    Discharge Placement PASRR number recieved: 05/09/24            Patient chooses bed at: Other - please specify in the comment section below: [facility]) Patient to be transferred to facility by: [facility] Name of family member notified: Pt and daughter, [name] Patient and family notified of of transfer: 05/10/24  Discharge Plan and Services Additional resources added to the After Visit Summary for     Discharge Planning Services: CM Consult            DME Arranged: N/A DME Agency: NA                  Social Drivers of Health (SDOH) Interventions SDOH Screenings   Food Insecurity: No Food Insecurity (02/02/2023)  Housing: Medium Risk (02/02/2023)  Transportation Needs: No Transportation Needs (02/02/2023)  Utilities: Not At Risk (02/02/2023)  Alcohol Screen: Low Risk  (02/02/2023)  Depression (PHQ2-9): Low Risk  (04/03/2024)  Financial Resource Strain: Low Risk  (02/02/2023)  Physical Activity: Insufficiently Active (02/02/2023)  Social Connections: Moderately Isolated (02/02/2023)  Stress: No Stress Concern  Present (02/02/2023)  Tobacco Use: High Risk (05/08/2024)     Readmission Risk Interventions    05/09/2024    3:18 PM  Readmission Risk Prevention Plan  Medication Screening Complete  Transportation Screening Complete

## 2024-05-10 NOTE — Progress Notes (Signed)
 Mobility Specialist Progress Note:    05/10/24 0932  Mobility  Activity Ambulated with assistance  Level of Assistance Minimal assist, patient does 75% or more  Assistive Device Front wheel walker  Distance Ambulated (ft) 12 ft  Range of Motion/Exercises Active;All extremities  RLE Weight Bearing Per Provider Order WBAT  Activity Response Tolerated well  Mobility Referral Yes  Mobility visit 1 Mobility  Mobility Specialist Start Time (ACUTE ONLY) 0932  Mobility Specialist Stop Time (ACUTE ONLY) [id number]  Mobility Specialist Time Calculation (min) (ACUTE ONLY) 20 min   Pt received in bed, agreeable to mobility. Required MinA to stand and ambulate with RW. Tolerated well, pain 6/10 during ambulation. Returned supine, all needs met.  Callista Hoh Mobility Specialist Please contact via Special Educational Needs Teacher or  Rehab office at [phone]

## 2024-05-10 NOTE — Progress Notes (Signed)
 Report called to Sierra, nurse at [employer]

## 2024-05-10 NOTE — TOC Progression Note (Signed)
 Transition of Care [facility]) - Progression Note    Patient Details  Name: Chelsea Ramos MRN: [id number] Date of Birth: 30-Apr-1941  [facility]) CM/SW Contact  Hoy DELENA Bigness, LCSW Phone Number: 05/10/2024, 10:20 AM  Clinical Narrative:    CSW reviewed bed offer with pt/daughter. Pt has accepted bed offer for [facility]. Auth currently pending.   [facility] [address] [place], [place] [address] [phone] Overall rating ?  [facility] [address] [place], [place] [address] [phone] Overall rating ?????   Expected Discharge Plan: Skilled Nursing Facility Barriers to Discharge: Continued Medical Work up               Expected Discharge Plan and Services   Discharge Planning Services: CM Consult   Living arrangements for the past 2 months: Single Family Home                                       Social Drivers of Health (SDOH) Interventions SDOH Screenings   Food Insecurity: No Food Insecurity (02/02/2023)  Housing: Medium Risk (02/02/2023)  Transportation Needs: No Transportation Needs (02/02/2023)  Utilities: Not At Risk (02/02/2023)  Alcohol Screen: Low Risk  (02/02/2023)  Depression (PHQ2-9): Low Risk  (04/03/2024)  Financial Resource Strain: Low Risk  (02/02/2023)  Physical Activity: Insufficiently Active (02/02/2023)  Social Connections: Moderately Isolated (02/02/2023)  Stress: No Stress Concern Present (02/02/2023)  Tobacco Use: High Risk (05/08/2024)    Readmission Risk Interventions    05/09/2024    3:18 PM  Readmission Risk Prevention Plan  Medication Screening Complete  Transportation Screening Complete

## 2024-05-10 NOTE — Discharge Instructions (Addendum)
 Instructions for orthopedic care Take the staples out on postop day 14 (05/22/24)  Weight-bear as tolerated Direct lateral hip precautions Dressing can stay on until postop day 14, unless it becomes completely soaked DVT prophylaxis aspirin 81 mg daily Follow-up 4 weeks with Dr Margrette    IMPORTANT INFORMATION: PAY CLOSE ATTENTION   PHYSICIAN DISCHARGE INSTRUCTIONS  Follow with Primary care provider  Chelsea Doffing, FNP  and other consultants as instructed by your Hospitalist Physician  SEEK MEDICAL CARE OR RETURN TO EMERGENCY ROOM IF SYMPTOMS COME BACK, WORSEN OR NEW PROBLEM DEVELOPS   Please note: You were cared for by a hospitalist during your hospital stay. Every effort will be made to forward records to your primary care provider.  You can request that your primary care provider send for your hospital records if they have not received them.  Once you are discharged, your primary care physician will handle any further medical issues. Please note that NO REFILLS for any discharge medications will be authorized once you are discharged, as it is imperative that you return to your primary care physician (or establish a relationship with a primary care physician if you do not have one) for your post hospital discharge needs so that they can reassess your need for medications and monitor your lab values.  Please get a complete blood count and chemistry panel checked by your Primary MD at your next visit, and again as instructed by your Primary MD.  Get Medicines reviewed and adjusted: Please take all your medications with you for your next visit with your Primary MD  Laboratory/radiological data: Please request your Primary MD to go over all hospital tests and procedure/radiological results at the follow up, please ask your primary care provider to get all Hospital records sent to his/her office.  In some cases, they will be blood work, cultures and biopsy results pending at the time of  your discharge. Please request that your primary care provider follow up on these results.  If you are diabetic, please bring your blood sugar readings with you to your follow up appointment with primary care.    Please call and make your follow up appointments as soon as possible.    Also Note the following: If you experience worsening of your admission symptoms, develop shortness of breath, life threatening emergency, suicidal or homicidal thoughts you must seek medical attention immediately by calling 911 or calling your MD immediately  if symptoms less severe.  You must read complete instructions/literature along with all the possible adverse reactions/side effects for all the Medicines you take and that have been prescribed to you. Take any new Medicines after you have completely understood and accpet all the possible adverse reactions/side effects.   Do not drive when taking Pain medications or sleeping medications (Benzodiazepines)  Do not take more than prescribed Pain, Sleep and Anxiety Medications. It is not advisable to combine anxiety,sleep and pain medications without talking with your primary care practitioner  Special Instructions: If you have smoked or chewed Tobacco  in the last 2 yrs please stop smoking, stop any regular Alcohol  and or any Recreational drug use.  Wear Seat belts while driving.  Do not drive if taking any narcotic, mind altering or controlled substances or recreational drugs or alcohol.

## 2024-05-11 DIAGNOSIS — Z96698 Presence of other orthopedic joint implants: Secondary | ICD-10-CM | POA: Diagnosis not present

## 2024-05-11 DIAGNOSIS — I10 Essential (primary) hypertension: Secondary | ICD-10-CM | POA: Diagnosis not present

## 2024-05-11 DIAGNOSIS — S72031D Displaced midcervical fracture of right femur, subsequent encounter for closed fracture with routine healing: Secondary | ICD-10-CM | POA: Diagnosis not present

## 2024-05-11 DIAGNOSIS — J449 Chronic obstructive pulmonary disease, unspecified: Secondary | ICD-10-CM | POA: Diagnosis not present

## 2024-05-11 NOTE — Anesthesia Postprocedure Evaluation (Signed)
 Anesthesia Post Note  Patient: Chelsea Ramos  Procedure(s) Performed: HEMIARTHROPLASTY (BIPOLAR) HIP, lateral  APPROACH FOR FRACTURE (Right: Hip)  Patient location during evaluation: Phase II Anesthesia Type: General Level of consciousness: awake Pain management: pain level controlled Vital Signs Assessment: post-procedure vital signs reviewed and stable Respiratory status: spontaneous breathing and respiratory function stable Cardiovascular status: blood pressure returned to baseline and stable Postop Assessment: no headache and no apparent nausea or vomiting Anesthetic complications: no Comments: Late entry   No notable events documented.   Last Vitals:  Vitals:   05/09/24 2135 05/10/24 0546  BP: 119/61 (!) 110/56  Pulse: (!) 102 (!) 108  Resp: 20 18  Temp: 36.7 C 37.2 C  SpO2: 99% 91%    Last Pain:  Vitals:   05/10/24 1148  TempSrc:   PainSc: 4                  Yvonna JINNY Bosworth

## 2024-05-14 ENCOUNTER — Telehealth: Payer: Self-pay | Admitting: Orthopedic Surgery

## 2024-05-14 DIAGNOSIS — J449 Chronic obstructive pulmonary disease, unspecified: Secondary | ICD-10-CM | POA: Diagnosis not present

## 2024-05-14 DIAGNOSIS — I10 Essential (primary) hypertension: Secondary | ICD-10-CM | POA: Diagnosis not present

## 2024-05-14 DIAGNOSIS — S72031D Displaced midcervical fracture of right femur, subsequent encounter for closed fracture with routine healing: Secondary | ICD-10-CM | POA: Diagnosis not present

## 2024-05-14 DIAGNOSIS — Z96698 Presence of other orthopedic joint implants: Secondary | ICD-10-CM | POA: Diagnosis not present

## 2024-05-14 NOTE — Telephone Encounter (Signed)
 faxed

## 2024-05-14 NOTE — Telephone Encounter (Signed)
 Dr. Areatha pt - pt is at [facility] [phone], Fax [phone] - they are requesting an order to be faxed to remove the staples at 12 to 14 days - DOS 05/08/24

## 2024-05-16 DIAGNOSIS — S72031D Displaced midcervical fracture of right femur, subsequent encounter for closed fracture with routine healing: Secondary | ICD-10-CM | POA: Diagnosis not present

## 2024-05-16 DIAGNOSIS — I10 Essential (primary) hypertension: Secondary | ICD-10-CM | POA: Diagnosis not present

## 2024-05-16 DIAGNOSIS — J449 Chronic obstructive pulmonary disease, unspecified: Secondary | ICD-10-CM | POA: Diagnosis not present

## 2024-05-16 DIAGNOSIS — K5900 Constipation, unspecified: Secondary | ICD-10-CM | POA: Diagnosis not present

## 2024-05-22 DIAGNOSIS — I10 Essential (primary) hypertension: Secondary | ICD-10-CM | POA: Diagnosis not present

## 2024-05-22 DIAGNOSIS — J449 Chronic obstructive pulmonary disease, unspecified: Secondary | ICD-10-CM | POA: Diagnosis not present

## 2024-05-22 DIAGNOSIS — Z96698 Presence of other orthopedic joint implants: Secondary | ICD-10-CM | POA: Diagnosis not present

## 2024-05-22 DIAGNOSIS — S72031D Displaced midcervical fracture of right femur, subsequent encounter for closed fracture with routine healing: Secondary | ICD-10-CM | POA: Diagnosis not present

## 2024-06-07 ENCOUNTER — Encounter: Payer: Self-pay | Admitting: Orthopedic Surgery

## 2024-06-07 ENCOUNTER — Ambulatory Visit: Admitting: Orthopedic Surgery

## 2024-06-07 DIAGNOSIS — S72001D Fracture of unspecified part of neck of right femur, subsequent encounter for closed fracture with routine healing: Secondary | ICD-10-CM

## 2024-06-07 NOTE — Progress Notes (Signed)
° ° °  06/07/2024   Chief Complaint  Patient presents with   Post-op Follow-up    Encounter Diagnosis  Name Primary?   Closed fracture of neck of right femur with routine healing, subsequent encounter hemiarthroplasty 05/08/24 Yes      POST OP VISIT   Patient: Chelsea Ramos           Date of Birth: August 19, 1940           MRN: [id number] Visit Date: 06/07/2024 Requested by: Bevely Doffing, FNP [phone] [place],  [place] [address] PCP: Bevely Doffing, FNP   Encounter Diagnosis  Name Primary?   Closed fracture of neck of right femur with routine healing, subsequent encounter hemiarthroplasty 05/08/24 Yes   PROCEDURE: Bipolar hemiarthroplasty right hip  Chief Complaint  Patient presents with   Post-op Follow-up    Allergies[1]   Current Medications[2]  PAIN MED: Tylenol  DVTPREV completed  None IMAGING: No results found.   ASSESSMENT AND PLAN:  Doing well has some right buttock pain recommend ibuprofen  Continue therapy return in 6 weeks  I have reviewed the patient's history and given the presence of a fragility fracture, I have deemed the necessity of a osteoporosis management referral or confirmed that the patient is currently enrolled in a osteoporosis treatment program.      [1]  Allergies Allergen Reactions   Losartan Itching   Prednisone Itching  [2]  Current Outpatient Medications:    acetaminophen  (TYLENOL ) 325 MG tablet, Take 2 tablets (650 mg total) by mouth in the morning, at noon, and at bedtime., Disp: , Rfl:    amLODipine  (NORVASC ) 5 MG tablet, Take 1 tablet (5 mg total) by mouth daily., Disp: 90 tablet, Rfl: 3   Ascorbic Acid (VITAMIN C PO), Take 1 tablet by mouth daily., Disp: , Rfl:    aspirin  81 MG tablet, Take 1 tablet (81 mg total) by mouth daily., Disp: , Rfl:    Cholecalciferol (VITAMIN D-3 PO), Take 1 tablet by mouth daily., Disp: , Rfl:    docusate sodium  (COLACE) 100 MG capsule, Take 1 capsule (100 mg total) by mouth 2  (two) times daily., Disp: , Rfl:    furosemide  (LASIX ) 20 MG tablet, Take 1 tablet (20 mg total) by mouth every other day as needed for fluid or edema., Disp: , Rfl:    ipratropium-albuterol  (DUONEB) 0.5-2.5 (3) MG/3ML SOLN, Take 3 mLs by nebulization every 4 (four) hours as needed., Disp: , Rfl:    LUTEIN PO, Take 1 capsule by mouth daily., Disp: , Rfl:    menthol  (CEPACOL) 3 MG lozenge, Take 1 lozenge (3 mg total) by mouth as needed for sore throat., Disp: , Rfl:    Multiple Vitamin (MULTIVITAMIN) capsule, Take 2 capsules by mouth daily., Disp: , Rfl:    pantoprazole  (PROTONIX ) 40 MG tablet, Take 1 tablet (40 mg total) by mouth every evening., Disp: , Rfl:

## 2024-06-07 NOTE — Progress Notes (Signed)
° ° °  06/07/2024   Chief Complaint  Patient presents with   Post-op Follow-up    Encounter Diagnosis  Name Primary?   Closed fracture of neck of right femur with routine healing, subsequent encounter hemiarthroplasty 05/08/24 Yes    What pharmacy do you use ? _____in facility______________________  DOI/DOS/ Date: 05/08/24  Did you get better, worse or no change (Answer below)   Improved Walking with walker and doing physical therapy twice a week

## 2024-06-11 ENCOUNTER — Telehealth: Payer: Self-pay

## 2024-06-11 ENCOUNTER — Inpatient Hospital Stay: Payer: Self-pay | Admitting: Internal Medicine

## 2024-06-11 NOTE — Telephone Encounter (Signed)
 Copied from CRM [id number]. Topic: Appointments - Appointment Cancel/Reschedule >> Jun 11, 2024  7:53 AM Rosaria BRAVO wrote: Pt is interested in being seen sooner, had to reschedule due to lack of sleep and upset stomach. Has hosp fu.

## 2024-06-11 NOTE — Telephone Encounter (Signed)
 Called patient she wants to keep 12/29 but afternoon with Leita

## 2024-06-25 ENCOUNTER — Inpatient Hospital Stay: Admitting: Internal Medicine

## 2024-06-25 ENCOUNTER — Ambulatory Visit (INDEPENDENT_AMBULATORY_CARE_PROVIDER_SITE_OTHER): Payer: Self-pay

## 2024-06-25 VITALS — BP 158/82 | HR 101 | Ht 60.0 in | Wt 96.0 lb

## 2024-06-25 DIAGNOSIS — S72001D Fracture of unspecified part of neck of right femur, subsequent encounter for closed fracture with routine healing: Secondary | ICD-10-CM

## 2024-06-25 DIAGNOSIS — F4323 Adjustment disorder with mixed anxiety and depressed mood: Secondary | ICD-10-CM

## 2024-06-25 MED ORDER — IBUPROFEN 600 MG PO TABS: 600.0000 mg | ORAL_TABLET | Freq: Three times a day (TID) | ORAL | 0 refills | Status: AC | PRN

## 2024-06-25 NOTE — Progress Notes (Unsigned)
 "  Established Patient Office Visit  Subjective   Patient ID: Chelsea Ramos, female    DOB: August 14, 1940  Age: 83 y.o. MRN: [id number]  Chief Complaint  Patient presents with   Hospitalization Follow-up    HPI  Close right hip fracture status post mechanical fall-status post right hip bipolar hemiarthroplasty, done on 05/08/2024, postop day 2 X-ray pelvis-  Mildly impacted, superiorly displaced transcervical right femoral neck fracture with varus angulation.   Chest x-ray EKG without acute abnormalities. - Orthopedics on board, outpatient follow-up with them on the scheduled appointment. - Continue with as needed analgesics - Continue with PT/OT and possible skilled nursing facility placement on discharge. - added daily pantoprazole  for GI protection while on daily aspirin   - acetaminophen  orded 650 mg TID for pain management  - tramadol  ordered PRN for severe pain only   Instructions for orthopedic care per Dr. Margrette Take the staples out on postop day 14 (05/22/24)  Weight-bear as tolerated Direct lateral hip precautions Dressing can stay on until postop day 14, unless it becomes completely soaked DVT prophylaxis aspirin  81 mg daily Follow-up 4 weeks with Dr Margrette      Mechanical fall, POA: I reviewed the CT of the head with did not show any acute intracranial abnormalities.  PT/OT with recommendation for SNF rehab placement    COPD - As needed inhalational bronchodilator therapy   Hypertension - Resumed Norvasc  5 mg   Discharge Diagnoses:  Principal Problem:   Closed fracture of neck of right femur (HCC) Active Problems:   Hypertension   COPD (chronic obstructive pulmonary disease) (HCC)    Patient Active Problem List   Diagnosis Date Noted   Closed fracture of neck of right femur (HCC) 05/07/2024   COPD (chronic obstructive pulmonary disease) (HCC) 05/07/2024   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/2024   S/P tympanic tube insertion 04/06/2024   Ventral hernia without  obstruction or gangrene 02/02/2023   RLQ abdominal pain 02/02/2023   Vaginal discharge 02/02/2023   Diverticulosis of colon without hemorrhage    Hypertension 02/06/2013   Arthritis 02/06/2013      ROS    Objective:     BP (!) 158/82   Pulse (!) 101   Ht 5' (1.524 m)   Wt 96 lb 0.6 oz (43.6 kg)   SpO2 94%   BMI 18.76 kg/m  BP Readings from Last 3 Encounters:  06/25/24 (!) 158/82  05/10/24 (!) 110/56  04/03/24 (!) 157/82   Wt Readings from Last 3 Encounters:  06/25/24 96 lb 0.6 oz (43.6 kg)  05/07/24 95 lb 0.3 oz (43.1 kg)  04/03/24 95 lb 1.9 oz (43.1 kg)     Physical Exam Vitals and nursing note reviewed.  Constitutional:      Appearance: Normal appearance.  HENT:     Head: Normocephalic.  Eyes:     Extraocular Movements: Extraocular movements intact.     Pupils: Pupils are equal, round, and reactive to light.  Cardiovascular:     Rate and Rhythm: Normal rate and regular rhythm.  Pulmonary:     Effort: Pulmonary effort is normal.     Breath sounds: Normal breath sounds.  Musculoskeletal:     Cervical back: Normal range of motion and neck supple.     Right hip: Tenderness present. No bony tenderness. Normal range of motion. Decreased strength.     Left hip: Normal.  Neurological:     Mental Status: She is alert and oriented to person, place, and time.  Psychiatric:  Mood and Affect: Mood normal.        Thought Content: Thought content normal.      No results found for any visits on 06/25/24.    The ASCVD Risk score (Arnett DK, et al., 2019) failed to calculate for the following reasons:   The 2019 ASCVD risk score is only valid for ages 66 to 104   * - Cholesterol units were assumed    Assessment & Plan:   Problem List Items Addressed This Visit       Musculoskeletal and Integument   Closed fracture of neck of right femur (HCC) - Primary   Routine healing with soreness and swelling post-surgery. Progressing well with physical therapy. -  Continue physical therapy twice a week. - Transition from walker to cane as advised by physical therapist. - Prescribed ibuprofen  600 mg with food, up to three times daily for pain and inflammation. - Apply ice to sore area as needed. - Provided form for handicapped parking permit. - Follow up with Doctor Margrette on January 22nd.      Relevant Medications   ibuprofen  (ADVIL ) 600 MG tablet    No follow-ups on file.    Leita Longs, FNP  "

## 2024-06-26 NOTE — Assessment & Plan Note (Signed)
 Routine healing with soreness and swelling post-surgery. Progressing well with physical therapy. - Continue physical therapy twice a week. - Transition from walker to cane as advised by physical therapist. - Prescribed ibuprofen  600 mg with food, up to three times daily for pain and inflammation. - Apply ice to sore area as needed. - Provided form for handicapped parking permit. - Follow up with Doctor Margrette on January 22nd.

## 2024-07-19 ENCOUNTER — Encounter: Payer: Self-pay | Admitting: Orthopedic Surgery

## 2024-07-19 ENCOUNTER — Ambulatory Visit (INDEPENDENT_AMBULATORY_CARE_PROVIDER_SITE_OTHER): Admitting: Orthopedic Surgery

## 2024-07-19 DIAGNOSIS — S72001D Fracture of unspecified part of neck of right femur, subsequent encounter for closed fracture with routine healing: Secondary | ICD-10-CM

## 2024-07-19 NOTE — Progress Notes (Signed)
"  ° °  Patient ID: Chelsea Ramos, female   DOB: Jan 16, 1941, 84 y.o.   MRN: [id number]    POST OP VISIT  POD # 2   Patient: Chelsea Ramos           Date of Birth: 1940-09-19           MRN: [id number] Visit Date: 07/19/2024 Requested by: Bevely Doffing, FNP [phone] [place],  [place] [address] PCP: Bevely Doffing, FNP  Chief Complaint  Patient presents with   Post-op Follow-up    Hemiarthroplasty for hip fracture right/ 05/08/24     Encounter Diagnosis  Name Primary?   Closed fracture of neck of right femur with routine healing, subsequent encounter hemiarthroplasty 05/08/24 Yes    PROCEDURE: bipolar/partial hip replacement   SUBJECTIVE:   No complaints today patient is ambulating with a cane with a level pelvis  EXAM FINDINGS:   INCISION: no sign of infection  Normal hip flexion, leg lengths equal, rotational alignment normal   Assessment and plan:  Doing well progressing ahead of schedule status post bipolar hemiarthroplasty for right hip fracture  in 4 AP pelvis AP lateral right hip   "

## 2024-07-19 NOTE — Progress Notes (Signed)
" ° ° °  07/19/2024   Chief Complaint  Patient presents with   Post-op Follow-up    Hemiarthroplasty for hip fracture right/ 05/08/24     Encounter Diagnosis  Name Primary?   Closed fracture of neck of right femur with routine healing, subsequent encounter hemiarthroplasty 05/08/24 Yes    What pharmacy do you use ? ____[place]_______________________  DOI/DOS/ Date: 05/08/24  Did you get better, worse or no change (Answer below)   Improved Walking with a cane now instead of walker Therapy has changed to once weekly due to good progress with twice weekly has 2 visits left      "

## 2024-10-03 ENCOUNTER — Ambulatory Visit

## 2024-11-16 ENCOUNTER — Ambulatory Visit: Admitting: Orthopedic Surgery
